# Patient Record
Sex: Male | Born: 1945 | Race: White | Hispanic: No | Marital: Married | Smoking: Never smoker
Health system: Southern US, Community
[De-identification: ages and names within clinical notes are randomized; demographics above are authoritative.]

## PROBLEM LIST (undated history)

## (undated) DIAGNOSIS — N138 Other obstructive and reflux uropathy: Secondary | ICD-10-CM

## (undated) DIAGNOSIS — G44209 Tension-type headache, unspecified, not intractable: Secondary | ICD-10-CM

## (undated) DIAGNOSIS — E785 Hyperlipidemia, unspecified: Secondary | ICD-10-CM

## (undated) DIAGNOSIS — R0982 Postnasal drip: Secondary | ICD-10-CM

## (undated) DIAGNOSIS — E663 Overweight: Secondary | ICD-10-CM

## (undated) DIAGNOSIS — N401 Enlarged prostate with lower urinary tract symptoms: Secondary | ICD-10-CM

## (undated) DIAGNOSIS — I10 Essential (primary) hypertension: Secondary | ICD-10-CM

## (undated) DIAGNOSIS — R0789 Other chest pain: Secondary | ICD-10-CM

## (undated) DIAGNOSIS — K5730 Diverticulosis of large intestine without perforation or abscess without bleeding: Secondary | ICD-10-CM

## (undated) HISTORY — DX: Benign prostatic hyperplasia with lower urinary tract symptoms: N40.1

## (undated) HISTORY — DX: Tension-type headache, unspecified, not intractable: G44.209

## (undated) HISTORY — DX: Hyperlipidemia, unspecified: E78.5

## (undated) HISTORY — DX: Other chest pain: R07.89

## (undated) HISTORY — DX: Postnasal drip: R09.82

## (undated) HISTORY — PX: COLONOSCOPY: SHX174

## (undated) HISTORY — DX: Essential (primary) hypertension: I10

## (undated) HISTORY — DX: Overweight: E66.3

## (undated) HISTORY — PX: TONSILLECTOMY: SUR1361

## (undated) HISTORY — DX: Other obstructive and reflux uropathy: N13.8

## (undated) HISTORY — DX: Diverticulosis of large intestine without perforation or abscess without bleeding: K57.30

---

## 2002-02-09 ENCOUNTER — Encounter: Payer: Self-pay | Admitting: Gastroenterology

## 2005-10-31 ENCOUNTER — Ambulatory Visit: Payer: Self-pay | Admitting: Internal Medicine

## 2005-11-14 ENCOUNTER — Ambulatory Visit: Payer: Self-pay | Admitting: Cardiology

## 2005-11-21 ENCOUNTER — Ambulatory Visit: Payer: Self-pay

## 2005-11-28 ENCOUNTER — Ambulatory Visit: Payer: Self-pay | Admitting: Cardiology

## 2006-12-09 ENCOUNTER — Ambulatory Visit: Payer: Self-pay | Admitting: Internal Medicine

## 2006-12-09 LAB — CONVERTED CEMR LAB
Albumin: 4.1 g/dL (ref 3.5–5.2)
Alkaline Phosphatase: 53 units/L (ref 39–117)
Basophils Absolute: 0 10*3/uL (ref 0.0–0.1)
Bilirubin Urine: NEGATIVE
Bilirubin, Direct: 0.3 mg/dL (ref 0.0–0.3)
CO2: 30 meq/L (ref 19–32)
Calcium: 9.2 mg/dL (ref 8.4–10.5)
Direct LDL: 125.7 mg/dL
GFR calc Af Amer: 79 mL/min
GFR calc non Af Amer: 66 mL/min
Glucose, Bld: 107 mg/dL — ABNORMAL HIGH (ref 70–99)
HCT: 52.5 % — ABNORMAL HIGH (ref 39.0–52.0)
HDL: 57.5 mg/dL (ref >39.0)
Hemoglobin: 17.8 g/dL — ABNORMAL HIGH (ref 13.0–17.0)
Ketones, ur: NEGATIVE mg/dL
Leukocytes, UA: NEGATIVE
Monocytes Absolute: 0.8 10*3/uL — ABNORMAL HIGH (ref 0.2–0.7)
Nitrite: NEGATIVE
PSA: 0.79 ng/mL (ref 0.10–4.00)
Potassium: 4.3 meq/L (ref 3.5–5.1)
RDW: 12 % (ref 11.5–14.6)
Specific Gravity, Urine: 1.01 (ref 1.000–1.03)
TSH: 0.5 microintl units/mL (ref 0.35–5.50)
Total Protein: 6.8 g/dL (ref 6.0–8.3)
Triglycerides: 101 mg/dL (ref 0–149)
VLDL: 20 mg/dL (ref 0–40)
WBC: 7.7 10*3/uL (ref 4.5–10.5)

## 2006-12-16 ENCOUNTER — Ambulatory Visit (HOSPITAL_COMMUNITY): Admission: RE | Admit: 2006-12-16 | Discharge: 2006-12-16 | Payer: Self-pay | Admitting: Internal Medicine

## 2007-10-12 DIAGNOSIS — J329 Chronic sinusitis, unspecified: Secondary | ICD-10-CM | POA: Insufficient documentation

## 2007-10-12 DIAGNOSIS — I10 Essential (primary) hypertension: Secondary | ICD-10-CM | POA: Insufficient documentation

## 2007-10-12 DIAGNOSIS — R0789 Other chest pain: Secondary | ICD-10-CM | POA: Insufficient documentation

## 2007-10-12 DIAGNOSIS — G44209 Tension-type headache, unspecified, not intractable: Secondary | ICD-10-CM

## 2007-10-12 DIAGNOSIS — E785 Hyperlipidemia, unspecified: Secondary | ICD-10-CM | POA: Insufficient documentation

## 2007-10-13 ENCOUNTER — Ambulatory Visit: Payer: Self-pay | Admitting: Internal Medicine

## 2007-10-13 ENCOUNTER — Telehealth (INDEPENDENT_AMBULATORY_CARE_PROVIDER_SITE_OTHER): Payer: Self-pay | Admitting: *Deleted

## 2008-01-06 ENCOUNTER — Ambulatory Visit: Payer: Self-pay | Admitting: Internal Medicine

## 2008-01-06 DIAGNOSIS — E663 Overweight: Secondary | ICD-10-CM | POA: Insufficient documentation

## 2008-01-06 DIAGNOSIS — K5730 Diverticulosis of large intestine without perforation or abscess without bleeding: Secondary | ICD-10-CM

## 2008-01-06 DIAGNOSIS — N400 Enlarged prostate without lower urinary tract symptoms: Secondary | ICD-10-CM

## 2008-04-04 ENCOUNTER — Ambulatory Visit: Payer: Self-pay | Admitting: Pulmonary Disease

## 2008-04-04 DIAGNOSIS — R21 Rash and other nonspecific skin eruption: Secondary | ICD-10-CM | POA: Insufficient documentation

## 2008-05-06 ENCOUNTER — Telehealth (INDEPENDENT_AMBULATORY_CARE_PROVIDER_SITE_OTHER): Payer: Self-pay | Admitting: *Deleted

## 2008-05-19 ENCOUNTER — Telehealth (INDEPENDENT_AMBULATORY_CARE_PROVIDER_SITE_OTHER): Payer: Self-pay | Admitting: *Deleted

## 2008-08-09 ENCOUNTER — Ambulatory Visit: Payer: Self-pay | Admitting: Internal Medicine

## 2008-08-09 DIAGNOSIS — R109 Unspecified abdominal pain: Secondary | ICD-10-CM | POA: Insufficient documentation

## 2008-11-22 ENCOUNTER — Ambulatory Visit: Payer: Self-pay | Admitting: Internal Medicine

## 2008-11-22 DIAGNOSIS — R109 Unspecified abdominal pain: Secondary | ICD-10-CM | POA: Insufficient documentation

## 2009-01-10 ENCOUNTER — Ambulatory Visit: Payer: Self-pay | Admitting: Gastroenterology

## 2009-01-10 DIAGNOSIS — K6289 Other specified diseases of anus and rectum: Secondary | ICD-10-CM

## 2009-01-10 DIAGNOSIS — R198 Other specified symptoms and signs involving the digestive system and abdomen: Secondary | ICD-10-CM

## 2009-01-13 ENCOUNTER — Ambulatory Visit: Payer: Self-pay | Admitting: Internal Medicine

## 2009-01-25 ENCOUNTER — Ambulatory Visit: Payer: Self-pay | Admitting: Gastroenterology

## 2009-06-29 ENCOUNTER — Ambulatory Visit: Payer: Self-pay | Admitting: Internal Medicine

## 2010-01-26 ENCOUNTER — Ambulatory Visit: Payer: Self-pay | Admitting: Internal Medicine

## 2010-01-26 LAB — CONVERTED CEMR LAB
ALT: 20 units/L (ref 0–53)
AST: 21 units/L (ref 0–37)
Albumin: 4.1 g/dL (ref 3.5–5.2)
BUN: 14 mg/dL (ref 6–23)
Basophils Absolute: 0.1 10*3/uL (ref 0.0–0.1)
Basophils Relative: 0.8 % (ref 0.0–3.0)
CRP, High Sensitivity: 0.47 (ref 0.00–5.00)
Cholesterol: 205 mg/dL — ABNORMAL HIGH (ref 0–200)
Creatinine, Ser: 1.1 mg/dL (ref 0.4–1.5)
Direct LDL: 125 mg/dL
Eosinophils Absolute: 0.1 10*3/uL (ref 0.0–0.7)
Eosinophils Relative: 1.1 % (ref 0.0–5.0)
HCT: 47.8 % (ref 39.0–52.0)
Lymphocytes Relative: 24 % (ref 12.0–46.0)
MCV: 94 fL (ref 78.0–100.0)
Neutro Abs: 4.2 10*3/uL (ref 1.4–7.7)
Neutrophils Relative %: 61.5 % (ref 43.0–77.0)
PSA: 0.63 ng/mL (ref 0.10–4.00)
RDW: 12.7 % (ref 11.5–14.6)
Total Bilirubin: 1.3 mg/dL — ABNORMAL HIGH (ref 0.3–1.2)
Total Protein: 6.8 g/dL (ref 6.0–8.3)
Triglycerides: 52 mg/dL (ref 0.0–149.0)
Urine Glucose: NEGATIVE mg/dL
Urobilinogen, UA: 0.2 (ref 0.0–1.0)
WBC: 6.9 10*3/uL (ref 4.5–10.5)

## 2010-08-19 LAB — CONVERTED CEMR LAB
ALT: 19 units/L (ref 0–53)
ALT: 20 units/L (ref 0–53)
AST: 22 units/L (ref 0–37)
Albumin: 4.1 g/dL (ref 3.5–5.2)
Alkaline Phosphatase: 41 units/L (ref 39–117)
Alkaline Phosphatase: 49 units/L (ref 39–117)
BUN: 19 mg/dL (ref 6–23)
Basophils Absolute: 0.1 10*3/uL (ref 0.0–0.1)
Basophils Relative: 0.7 % (ref 0.0–1.0)
Basophils Relative: 0.8 % (ref 0.0–3.0)
Bilirubin Urine: NEGATIVE
Bilirubin, Direct: 0.2 mg/dL (ref 0.0–0.3)
Bilirubin, Direct: 0.3 mg/dL (ref 0.0–0.3)
CO2: 32 meq/L (ref 19–32)
Calcium: 9.4 mg/dL (ref 8.4–10.5)
Chloride: 101 meq/L (ref 96–112)
Chloride: 105 meq/L (ref 96–112)
Creatinine, Ser: 1.2 mg/dL (ref 0.4–1.5)
Creatinine, Ser: 1.2 mg/dL (ref 0.4–1.5)
Direct LDL: 130.5 mg/dL
Eosinophils Relative: 1.3 % (ref 0.0–5.0)
Glucose, Bld: 111 mg/dL — ABNORMAL HIGH (ref 70–99)
HCT: 50.6 % (ref 39.0–52.0)
Hemoglobin, Urine: NEGATIVE
Hemoglobin: 16.9 g/dL (ref 13.0–17.0)
Lymphocytes Relative: 24.5 % (ref 12.0–46.0)
MCHC: 34.6 g/dL (ref 30.0–36.0)
MCV: 94 fL (ref 78.0–100.0)
MCV: 95 fL (ref 78.0–100.0)
Monocytes Absolute: 0.7 10*3/uL (ref 0.1–1.0)
Monocytes Relative: 10.4 % (ref 3.0–12.0)
Neutro Abs: 4.1 10*3/uL (ref 1.4–7.7)
Neutrophils Relative %: 62.2 % (ref 43.0–77.0)
PSA: 0.55 ng/mL (ref 0.10–4.00)
Potassium: 4.5 meq/L (ref 3.5–5.1)
RBC: 5.33 M/uL (ref 4.22–5.81)
Sodium: 140 meq/L (ref 135–145)
Sodium: 141 meq/L (ref 135–145)
Specific Gravity, Urine: 1.01 (ref 1.000–1.030)
TSH: 0.59 microintl units/mL (ref 0.35–5.50)
TSH: 0.67 microintl units/mL (ref 0.35–5.50)
Total CHOL/HDL Ratio: 3.7
Total Protein, Urine: NEGATIVE mg/dL
Triglycerides: 58 mg/dL (ref 0.0–149.0)
Triglycerides: 79 mg/dL (ref 0–149)
WBC: 6.5 10*3/uL (ref 4.5–10.5)

## 2010-08-21 NOTE — Assessment & Plan Note (Signed)
Summary: Primary svc/ cpx   Copy to:  n/a Primary Provider/Referring Provider:  Sandrea Hughs, Colin Palmer  CC:  cpx fasting.  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  He also has moderate obesity with a target weight of around 171 pounds.   Nov 22, 2008 change in bowel habits since changed diet to lots of greens assoc with migratory abd pain. no melena or hematochezia. colonoscopy nl 2003 except for diverticulosis.  no wt loss.  ? fever this am with no acute worsening in gi symptoms.   rx citrcucel/ diet > helped alot with pain but still irreg bm's  January 13, 2009 cpx better except bm still irreg and saw R Arlyce Dice and plan colonoscopy 01/25/09> nl  June 29, 2009 6 month followup hbp, wt control.  ex x 5 min on eliptical without cp/sob.  January 26, 2010 cpx no problem with aerobics in terms of sob or cp. no tia or claudication  Current Medications (verified): 1)  Bayer Aspirin 325 Mg  Tabs (Aspirin) .... Take 1 Tablet By Mouth Once A Day 2)  Micardis Hct 80-25 Mg  Tabs (Telmisartan-Hctz) .... Take 1 Tablet By Mouth Once A Day 3)  Toprol Xl 25 Mg  Tb24 (Metoprolol Succinate) .... Take 1 Tablet By Mouth Once A Day 4)  Advil 200 Mg  Tabs (Ibuprofen) .... As Needed 5)  Tums 500 Mg Chew (Calcium Carbonate Antacid) .... As Needed 6)  Fish Oil   Oil (Fish Oil) .Marland Kitchen.. 1 Two Times A Day 7)  Citrucel  Powd (Methylcellulose (Laxative)) .Marland Kitchen.. 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 lung cancer in father ovarian  cancer in mother no siblings No FH of Colon Cancer  Social History: Reviewed history from 01/10/2009 and no changes required. denies ever smoking works as Pensions consultant very stressful Alcohol Use - yes: one glass of wine daily Illicit Drug Use - no Daily Caffeine Use: one cup of coffee daily  Patient gets regular exercise.  Vital Signs:  Patient profile:   65 year old male Weight:      173 pounds BMI:     26.40 O2 Sat:      97 % on Room air Temp:     98.0 degrees F oral Pulse rate:   83 / minute BP sitting:   110 / 80  (left arm)  Vitals Entered By: Vernie Murders (January 26, 2010 8:50 AM)  O2 Flow:  Room air  Physical Exam  Additional Exam:  ambulatory healthy appearing white male in no distress.  wt 194 August 09, 2008 > 191 Nov 22, 2008 >  185 January 13, 2009 > 179 June 29, 2009 > 173 January 26, 2010   HEENT: nl dentition, turbinates, and orophanx. Nl external ear canals without cough reflex NECK :  without JVD/Nodes/TM/ nl carotid upstrokes bilaterally LUNGS: no acc muscle use, clear to A and P bilaterally without cough on insp or exp maneuvers CV:  RRR  no s3 or murmur or increase in P2, no edema  ABD:  soft and nontender with nl excursion in the supine position. No bruits or organomegaly, bowel sounds nl MS:  warm without deformities, calf tenderness, cyanosis or clubbing GU testes down bilaterallly, no nodules or ih Rectal mild bph,  smooth, stool G neg no nodules Skin fair complexion no  lestions   Cholesterol          [H]  205 mg/dL                   0-454     ATP III Classification            Desirable:  < 200 mg/dL                    Borderline High:  200 - 239 mg/dL               High:  > = 240 mg/dL   Triglycerides             52.0 mg/dL                  0.9-811.9     Normal:  <150 mg/dL     Borderline High:  147 - 199 mg/dL   HDL                       82.95 mg/dL                 >62.13   VLDL Cholesterol          10.4 mg/dL                  0.8-65.7  CHO/HDL Ratio:  CHD Risk                             3                    Men          Women     1/2 Average Risk     3.4          3.3     Average Risk          5.0          4.4     2X Average Risk          9.6          7.1     3X Average Risk          15.0          11.0                           Tests: (2) BMP (METABOL)   Sodium                    138 mEq/L  135-145   Potassium                 4.3 mEq/L                   3.5-5.1   Chloride                  102 mEq/L                   96-112   Carbon Dioxide            29 mEq/L                    19-32   Glucose              [H]  102 mg/dL                   16-10   BUN                       14 mg/dL                    9-60   Creatinine                1.1 mg/dL                   4.5-4.0   Calcium                   9.2 mg/dL                   9.8-11.9   GFR                       71.63 mL/min                >60  Tests: (3) CBC Platelet w/Diff (CBCD)   White Cell Count          6.9 K/uL                    4.5-10.5   Red Cell Count            5.09 Mil/uL                 4.22-5.81   Hemoglobin                16.6 g/dL                   14.7-82.9   Hematocrit                47.8 %                      39.0-52.0   MCV                       94.0 fl                     78.0-100.0   MCHC                      34.8 g/dL                   56.2-13.0   RDW  12.7 %                       11.5-14.6   Platelet Count            280.0 K/uL                  150.0-400.0   Neutrophil %              61.5 %                      43.0-77.0   Lymphocyte %              24.0 %                      12.0-46.0   Monocyte %           [H]  12.6 %                      3.0-12.0   Eosinophils%              1.1 %                       0.0-5.0   Basophils %               0.8 %                       0.0-3.0   Neutrophill Absolute      4.2 K/uL                    1.4-7.7   Lymphocyte Absolute       1.6 K/uL                    0.7-4.0   Monocyte Absolute         0.9 K/uL                    0.1-1.0  Eosinophils, Absolute                             0.1 K/uL                    0.0-0.7   Basophils Absolute        0.1 K/uL                    0.0-0.1  Tests: (4) Hepatic/Liver Function Panel (HEPATIC)   Total Bilirubin      [H]  1.3 mg/dL                   1.6-1.0   Direct Bilirubin          0.2 mg/dL                   9.6-0.4   Alkaline Phosphatase      44 U/L                      39-117   AST                       21 U/L  0-37   ALT                       20 U/L                      0-53   Total Protein             6.8 g/dL                    9.8-1.1   Albumin                   4.1 g/dL                    9.1-4.7  Tests: (5) TSH (TSH)   FastTSH                   0.66 uIU/mL                 0.35-5.50  Tests: (6) Prostate Specific Antigen (PSA)   PSA-Hyb                   0.63 ng/mL                  0.10-4.00  Tests: (7) UDip Only (UDIP)   Color                     YELLOW       RANGE:  Yellow;Lt. Yellow   Clarity                   CLEAR                       Clear   Specific Gravity          1.010                       1.000 - 1.030   Urine Ph                  7.0                         5.0-8.0   Protein                   TRACE                       Negative   Urine Glucose             NEGATIVE                    Negative   Ketones                   NEGATIVE                     Negative   Urine Bilirubin           NEGATIVE                    Negative   Blood                     NEGATIVE                    Negative  Urobilinogen              0.2                         0.0 - 1.0   Leukocyte Esterace        NEGATIVE                    Negative   Nitrite                   NEGATIVE                    Negative  Tests: (8) Full Range CRP (FCRP)   CRPH                      0.47 mg/L                   0.00-5.00     Note:  An elevated hs-CRP (>5 mg/L) should be repeated after 2 weeks to rule out recent infection or trauma.  Tests: (9) Cholesterol LDL - Direct (DIRLDL)  Cholesterol LDL - Direct                             125.0 mg/dL  CXR  Procedure date:  01/26/2010  Findings:      Comparison: 01/13/2009   Findings: Mild convex right thoracic spine curvature with spondylosis. Midline trachea.  Normal heart size and mediastinal contours. No pleural effusion or pneumothorax.  Clear lungs.   IMPRESSION: No acute cardiopulmonary disease.  Impression & Recommendations:  Problem # 1:  HYPERTENSION (ICD-401.9) ok on rx  His updated medication list for this problem includes:    Micardis Hct 80-25 Mg Tabs (Telmisartan-hctz) .Marland Kitchen... Take 1 tablet by mouth once a day    Toprol Xl 25 Mg Tb24 (Metoprolol succinate) .Marland Kitchen... Take 1 tablet by mouth once a day     Problem # 2:  HYPERLIPIDEMIA (ICD-272.4)  Target LDL < 130   Labs Reviewed: SGOT: 22 (01/13/2009)   SGPT: 19 (01/13/2009)   HDL:62.60 (01/13/2009), 54.8 (01/06/2008)  LDL:115 (01/13/2009) > 125 January 26, 2010 so no change in rx , DEL (01/06/2008)  Chol:189 (01/13/2009), 202 (01/06/2008)  Trig:58.0 (01/13/2009), 79 (01/06/2008)  Medications Added to Medication List This Visit: 1)  Fish Oil Oil (Fish oil) .Marland Kitchen.. 1 two times a day 2)  Citrucel Powd (Methylcellulose (laxative)) .Marland Kitchen.. 1 tbsp once daily  Other Orders: EKG w/ Interpretation (93000) TLB-Lipid Panel (80061-LIPID) TLB-BMP (Basic Metabolic  Panel-BMET) (80048-METABOL) TLB-CBC Platelet - w/Differential (85025-CBCD) TLB-Hepatic/Liver Function Pnl (80076-HEPATIC) TLB-TSH (Thyroid Stimulating Hormone) (84443-TSH) TLB-PSA (Prostate Specific Antigen) (84153-PSA) TLB-Udip ONLY (81003-UDIP) TLB-CRP-High Sensitivity (C-Reactive Protein) (86140-FCRP) T-2 View CXR (71020TC) Est. Patient 40-64 years (29528)  Patient Instructions: 1)  Call [phone] for your results w/in next 3 days - if there's something important  I feel you need to know,  I'll be in touch with you directly.    CardioPerfect ECG  ID: [id number] Patient: Colin Palmer, Colin Palmer DOB: 1945-11-18 Age: 65 Years Old Sex: Male Race: Other Height: 68 Weight: 173 Status: Unconfirmed Past Medical History:  OVERWEIGHT (ICD-278.02)     - Target wt  =  185  for BMI < 30,   ideal 166 for BMI < 26 DIVERTICULOSIS OF COLON (ICD-562.10).............................................R Arlyce Dice     - Diverticulosis by colonoscopy July 2003, repeat  January 25, 2009 no change    BENIGN PROSTATIC HYPERTROPHY, WITH OBSTRUCTION (ICD-600.01) HYPERLIPIDEMIA (ICD-272.4) target LDL < 130 male, hbp HEADACHE, TENSION (ICD-307.81) 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

## 2010-12-04 NOTE — Assessment & Plan Note (Signed)
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  He has  moderate obesity as well and has not been making progress with a target  weight of 171 pounds but has been relatively stable around 200 pounds  and for the last several years.  He denies however, any exertional chest  pain, orthopnea, PND or leg swelling and his weight is actually trending  slightly downward over time.   His new complaint today is one of right arm paresthesias that extended  into the C6 dermatome.  This occurred acutely after an injury in his  right arm in September 2007.  He denies any associated neck pain or  weakness in the arm.  His main complaint is one of increased  sensitivity of the skin.   PAST MEDICAL HISTORY:  Is significant for:  1. Chest pain, migratory in nature and felt to be atypical of negative      cardiac workup in May 2007, including a stress nuclear study.  2. Hypertension.  3. Non-specific post nasal drip syndrome.  4. Stress related headaches.  5. Hyperlipidemia as noted above.   ALLERGIES:  None known.   MEDICATIONS:  1. Micardis 80/25 one daily.  2. Toprol XL 25 mg daily.  3. Adult aspirin daily.   SOCIAL HISTORY:  He has never smoked.  He works as a Clinical research associate.  He admits  he does not get any regular exercise.   FAMILY HISTORY:  Is positive for lung cancer in his father, ovarian  cancer in his mother and he has no siblings.   REVIEW OF SYSTEMS:  Taken in detail in worksheet.  Negative except as  outlined above.   PHYSICAL  EXAMINATION:  This is a pleasant ambulatory fit appearing white  male, no acute distress with the weight now of 191 pounds, which is down  about 5 pounds from a year ago with a peak weight on our records of 211.  HEENT:  Is unremarkable.  Oropharynx is clear.  Dentition is intact.  Limited funduscopy revealed no significant retinal or arteriolar change.  NECK:  Was supple without cervical adenopathy or tenderness and there  were no radicular symptoms elicited on manipulation.  There is a regular rhythm without murmur, gallop, rub or displacement of  PMI.  ABDOMEN:  Is soft, benign with no palpable organomegaly, masses or  tenderness.  EXTREMITIES:  Are warm without calf tenderness, cyanosis, clubbing or  edema.  NEUROLOGIC:  Exam of the right upper extremity was normal with normal  pulses.   LAB DATA:  Chest x-ray was normal.  EKG was normal.  Chemistry profile  was unremarkable.  LDL cholesterol  was 126 with an HDL of 58, CBC was  normal.  TSH was normal, PSA was 0.79.   IMPRESSION:  1. 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  I      suspect he stretched the C6 nerve root when he hyperextended his      right arm but would like to rule out a disc protruding at the C6      level and recommended an MRI scan for this purpose.  2. Atypical chest pain, has resolved over the last year with negative      stress Cardiolite in 2007.  This is either related to stress or      irritable bowel syndrome or reflux but has not been a recurrent      problem.  3. Hyperlipidemia with target LDL at target of less than 130.  4. History of seasonal rhinitis, not requiring treatment.  5. Diverticulosis by colonoscopy July 2003 and could be for recall      July 2008.  6. History of headaches, no longer active.  7. General health maintenance.  He was updated on tetanus 2007, not      yet a candidate for Pneumovax.     Charlaine Dalton. Sherene Sires, MD, [facility]  Electronically Signed    MBW/MedQ   DD: 12/11/2006  DT: 12/11/2006  Job #: [id number]

## 2011-02-04 ENCOUNTER — Encounter: Payer: Self-pay | Admitting: Internal Medicine

## 2011-02-05 ENCOUNTER — Ambulatory Visit (INDEPENDENT_AMBULATORY_CARE_PROVIDER_SITE_OTHER): Payer: BC Managed Care – PPO | Admitting: Internal Medicine

## 2011-02-05 ENCOUNTER — Other Ambulatory Visit (INDEPENDENT_AMBULATORY_CARE_PROVIDER_SITE_OTHER): Payer: BC Managed Care – PPO

## 2011-02-05 ENCOUNTER — Ambulatory Visit (INDEPENDENT_AMBULATORY_CARE_PROVIDER_SITE_OTHER)
Admission: RE | Admit: 2011-02-05 | Discharge: 2011-02-05 | Disposition: A | Discharge status: Home / Self Care | Payer: BC Managed Care – PPO | Source: Ambulatory Visit | Attending: Internal Medicine | Admitting: Internal Medicine

## 2011-02-05 ENCOUNTER — Encounter: Payer: Self-pay | Admitting: Internal Medicine

## 2011-02-05 VITALS — BP 116/80 | HR 73 | Temp 98.5°F | Ht 68.0 in | Wt 175.0 lb

## 2011-02-05 DIAGNOSIS — E785 Hyperlipidemia, unspecified: Secondary | ICD-10-CM

## 2011-02-05 DIAGNOSIS — R21 Rash and other nonspecific skin eruption: Secondary | ICD-10-CM

## 2011-02-05 DIAGNOSIS — I10 Essential (primary) hypertension: Secondary | ICD-10-CM

## 2011-02-05 DIAGNOSIS — R109 Unspecified abdominal pain: Secondary | ICD-10-CM

## 2011-02-05 LAB — CBC WITH DIFFERENTIAL/PLATELET
Basophils Relative: 0.5 % (ref 0.0–3.0)
Eosinophils Relative: 1.1 % (ref 0.0–5.0)
HCT: 46.7 % (ref 39.0–52.0)
Lymphs Abs: 1.4 10*3/uL (ref 0.7–4.0)
MCV: 94.7 fl (ref 78.0–100.0)
Monocytes Absolute: 0.7 10*3/uL (ref 0.1–1.0)
Monocytes Relative: 10.8 % (ref 3.0–12.0)
Neutrophils Relative %: 67.2 % (ref 43.0–77.0)
RBC: 4.94 Mil/uL (ref 4.22–5.81)
WBC: 6.9 10*3/uL (ref 4.5–10.5)

## 2011-02-05 LAB — BASIC METABOLIC PANEL
BUN: 13 mg/dL (ref 6–23)
CO2: 30 mEq/L (ref 19–32)
Chloride: 98 mEq/L (ref 96–112)
Potassium: 4.3 mEq/L (ref 3.5–5.1)

## 2011-02-05 LAB — LIPID PANEL
HDL: 77.5 mg/dL (ref >39.00)
Total CHOL/HDL Ratio: 2
VLDL: 8.8 mg/dL (ref 0.0–40.0)

## 2011-02-05 LAB — URINALYSIS
Ketones, ur: NEGATIVE
Leukocytes, UA: NEGATIVE
Specific Gravity, Urine: 1.015 (ref 1.000–1.030)
pH: 7 (ref 5.0–8.0)

## 2011-02-05 LAB — TSH: TSH: 0.69 u[IU]/mL (ref 0.35–5.50)

## 2011-02-05 LAB — HEPATIC FUNCTION PANEL
Albumin: 4.5 g/dL (ref 3.5–5.2)
Total Bilirubin: 1.3 mg/dL — ABNORMAL HIGH (ref 0.3–1.2)

## 2011-02-05 NOTE — Progress Notes (Signed)
Quick Note:  Spoke with pt and notified of results per Dr. Wert. Pt verbalized understanding and denied any questions.  ______ 

## 2011-02-05 NOTE — Assessment & Plan Note (Signed)
Adequate control on present rx, reviewed  

## 2011-02-05 NOTE — Patient Instructions (Signed)
We will call you with lab reports  Keep up the good work on exercise  Return in one year for physical, sooner if needed

## 2011-02-05 NOTE — Progress Notes (Signed)
Subjective:     Patient ID: Colin Palmer, male   DOB: Mar 18, 1946, 65 y.o.   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 He also has moderate obesity with a target weight of around 171 pounds.   Nov 22, 2008 change in bowel habits since changed diet to lots of greens assoc with migratory abd pain. no melena or hematochezia. colonoscopy nl 2003 except for diverticulosis. no wt loss. ? fever this am with no acute worsening in gi symptoms. rx citrcucel/ diet > helped alot with pain but still irreg bm's    January 13, 2009 cpx better except bm still irreg and saw R Arlyce Dice and plan colonoscopy 01/25/09> nl   02/05/2011  Ov/ Simona Rocque cpx   tia or claudication. Pt denies any significant sore throat, dysphagia, itching, sneezing,  nasal congestion or excess/ purulent secretions,  fever, chills, sweats, unintended wt loss, pleuritic or exertional cp, hempoptysis, orthopnea pnd or leg swelling.    Also denies any obvious fluctuation of symptoms with weather or environmental changes or other aggravating or alleviating factors.             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)  CHEST PAIN, ATYPICAL (ICD-786.59)  HYPERTENSION (ICD-401.9)  Fair complexion Pos Ak's.......................................................................Marland KitchenTurner  HEALTH MAINTENANCE.........................................................................Marland KitchenWert  - Td 10/2005  - CPX 02/05/2011   Family  History:   lung cancer in father  ovarian cancer in mother  no siblings  No FH of Colon Cancer   Social History:   denies ever smoking  works as Pensions consultant very stressful  Alcohol Use - yes: one glass of wine daily  Illicit Drug Use - no  Daily Caffeine Use: one cup of coffee daily  Patient gets regular exercise.     Review of Systems     Objective:   Physical Exam ambulatory healthy appearing white male in no distress.   wt 194 August 09, 2008 >   > 179 June 29, 2009 > 173 January 26, 2010 > 02/05/2011 175 HEENT: nl dentition, turbinates, and orophanx. Nl external ear canals without cough reflex  NECK : without JVD/Nodes/TM/ nl carotid upstrokes bilaterally  LUNGS: no acc muscle use, clear to A and P bilaterally without cough on insp or exp maneuvers  CV: RRR no s3 or murmur or increase in P2, no edema  ABD: soft and nontender with nl excursion in the supine position. No bruits or organomegaly, bowel sounds nl  MS: warm without deformities, calf tenderness, cyanosis or clubbing  GU testes down bilaterallly, no nodules or ih  Rectal mild bph, smooth, stool G neg no nodules  Skin fair complexion no lestions    02/05/2011 cxr No acute disease. Stable with prior exam.   Assessment:         Plan:

## 2011-08-21 ENCOUNTER — Other Ambulatory Visit: Payer: Self-pay | Admitting: Internal Medicine

## 2011-08-21 MED ORDER — TELMISARTAN-HCTZ 80-25 MG PO TABS: 1.0000 | ORAL_TABLET | Freq: Every day | ORAL | Status: DC

## 2011-08-21 MED ORDER — METOPROLOL SUCCINATE ER 25 MG PO TB24: 25.0000 mg | ORAL_TABLET | Freq: Every day | ORAL | Status: DC

## 2011-08-21 NOTE — Telephone Encounter (Signed)
Rx refills for [facility]oprolol and [facility]is [facility] per [facility], okayed by MW.

## 2012-02-19 ENCOUNTER — Other Ambulatory Visit (INDEPENDENT_AMBULATORY_CARE_PROVIDER_SITE_OTHER): Payer: BC Managed Care – PPO

## 2012-02-19 ENCOUNTER — Ambulatory Visit (INDEPENDENT_AMBULATORY_CARE_PROVIDER_SITE_OTHER): Payer: BC Managed Care – PPO | Admitting: Internal Medicine

## 2012-02-19 ENCOUNTER — Ambulatory Visit (INDEPENDENT_AMBULATORY_CARE_PROVIDER_SITE_OTHER)
Admission: RE | Admit: 2012-02-19 | Discharge: 2012-02-19 | Disposition: A | Discharge status: Home / Self Care | Payer: BC Managed Care – PPO | Source: Ambulatory Visit | Attending: Internal Medicine | Admitting: Internal Medicine

## 2012-02-19 ENCOUNTER — Encounter: Payer: Self-pay | Admitting: Internal Medicine

## 2012-02-19 VITALS — BP 120/70 | HR 67 | Temp 98.4°F | Ht 67.0 in | Wt 176.4 lb

## 2012-02-19 DIAGNOSIS — I10 Essential (primary) hypertension: Secondary | ICD-10-CM

## 2012-02-19 DIAGNOSIS — E785 Hyperlipidemia, unspecified: Secondary | ICD-10-CM

## 2012-02-19 DIAGNOSIS — K5730 Diverticulosis of large intestine without perforation or abscess without bleeding: Secondary | ICD-10-CM

## 2012-02-19 DIAGNOSIS — Z23 Encounter for immunization: Secondary | ICD-10-CM

## 2012-02-19 DIAGNOSIS — N401 Enlarged prostate with lower urinary tract symptoms: Secondary | ICD-10-CM

## 2012-02-19 LAB — BASIC METABOLIC PANEL
BUN: 12 mg/dL (ref 6–23)
CO2: 30 mEq/L (ref 19–32)
Chloride: 94 mEq/L — ABNORMAL LOW (ref 96–112)
Potassium: 4.5 mEq/L (ref 3.5–5.1)

## 2012-02-19 LAB — URINALYSIS
Nitrite: NEGATIVE
Total Protein, Urine: NEGATIVE
Urine Glucose: NEGATIVE
pH: 7 (ref 5.0–8.0)

## 2012-02-19 LAB — CBC WITH DIFFERENTIAL/PLATELET
Basophils Absolute: 0.1 10*3/uL (ref 0.0–0.1)
Eosinophils Absolute: 0.1 10*3/uL (ref 0.0–0.7)
HCT: 49.9 % (ref 39.0–52.0)
Lymphs Abs: 1.4 10*3/uL (ref 0.7–4.0)
MCHC: 34.4 g/dL (ref 30.0–36.0)
MCV: 95.3 fl (ref 78.0–100.0)
Monocytes Absolute: 0.7 10*3/uL (ref 0.1–1.0)
Platelets: 287 10*3/uL (ref 150.0–400.0)
RDW: 12.7 % (ref 11.5–14.6)

## 2012-02-19 LAB — LIPID PANEL
HDL: 79.3 mg/dL (ref >39.00)
Triglycerides: 52 mg/dL (ref 0.0–149.0)
VLDL: 10.4 mg/dL (ref 0.0–40.0)

## 2012-02-19 LAB — HEPATIC FUNCTION PANEL
ALT: 17 U/L (ref 0–53)
AST: 17 U/L (ref 0–37)
Alkaline Phosphatase: 47 U/L (ref 39–117)
Total Bilirubin: 1.2 mg/dL (ref 0.3–1.2)

## 2012-02-19 NOTE — Progress Notes (Signed)
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 He also has moderate obesity with a target weight of around 171 pounds.   Nov 22, 2008 change in bowel habits since changed diet to lots of greens assoc with migratory abd pain. no melena or hematochezia. colonoscopy nl 2003 except for diverticulosis. no wt loss. ? fever this am with no acute worsening in gi symptoms. rx citrcucel/ diet > helped alot with pain but still irreg bm's    January 13, 2009 cpx better except bm still irreg and saw R Arlyce Dice and plan colonoscopy 01/25/09> nl   02/19/2012 CPX/Reather Steller cc  No complaints, no tia, claudication, cp, sob. Good ex tol  ROS  The following are not active complaints unless bolded sore throat, dysphagia, dental problems, itching, sneezing,  nasal congestion or excess/ purulent secretions, ear ache,   fever, chills, sweats, unintended wt loss, pleuritic or exertional cp, hemoptysis,  orthopnea pnd or leg swelling, presyncope, palpitations, heartburn, abdominal pain, anorexia, nausea, vomiting, diarrhea  or change in bowel or urinary habits, change in stools or urine, dysuria,hematuria,  rash, arthralgias, visual complaints, headache, numbness weakness or ataxia or problems with walking or coordination,  change in mood/affect or memory.            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)  CHEST PAIN, ATYPICAL (ICD-786.59)    HYPERTENSION (ICD-401.9)  Fair complexion Pos Ak's.......................................................................Marland KitchenTurner  HEALTH MAINTENANCE.........................................................................Marland KitchenWert  - Td 10/2005  - Pneumovax 02/19/2012 (age 66)  - CPX 02/19/2012   Family History:  lung cancer in father  ovarian cancer in mother  no siblings  No FH of Colon Cancer   Social History:   denies ever smoking  works as Pensions consultant very stressful  Alcohol Use - yes: one glass of wine daily  Illicit Drug Use - no  Daily Caffeine Use: one cup of coffee daily  Patient gets regular exercise.           Objective:   Physical Exam ambulatory healthy appearing white male in no distress.   wt 194 August 09, 2008 > 179 June 29, 2009 >  02/05/2011 175> 02/19/2012  176 HEENT: nl dentition, turbinates, and orophanx. Nl external ear canals without cough reflex  NECK : without JVD/Nodes/TM/ nl carotid upstrokes bilaterally  LUNGS: no acc muscle use, clear to A and P bilaterally without cough on insp or exp maneuvers  CV: RRR no s3 or murmur or increase in P2, no edema  ABD: soft and nontender with nl excursion in the supine position. No bruits or organomegaly, bowel sounds nl  MS: warm without deformities, calf tenderness, cyanosis or clubbing  GU testes down bilaterallly, no nodules or ih  Rectal mild bph, smooth, stool G neg no nodules  Skin fair complexion no lestions  CXR  02/19/2012 : Comparison: 02/05/2011  Findings: Cardiomediastinal silhouette is stable. No acute infiltrate or pleural effusion. No pulmonary edema. Stable minimal dextroscoliosis.  IMPRESSION: No active disease.  No significant change.       Assessment:         Plan:

## 2012-02-19 NOTE — Patient Instructions (Addendum)
Pneumovax given today.  Please remember to go to the lab and x-ray department downstairs for your tests - we will call you with the results when they are available- and fax you at that point any results you need for your insurance discount     Please schedule a follow up office visit in 12 months, call sooner if needed  - CPX on return

## 2012-02-20 ENCOUNTER — Telehealth: Payer: Self-pay | Admitting: Internal Medicine

## 2012-02-20 NOTE — Assessment & Plan Note (Signed)
-   Diverticulosis by colonoscopy July 2003, repeat January 25, 2009 no change

## 2012-02-20 NOTE — Assessment & Plan Note (Signed)
Discussed in detail all the  indications, usual  risks and alternatives  relative to the benefits with patient who declined psa testing

## 2012-02-20 NOTE — Progress Notes (Signed)
Quick Note:  Spoke with pt and notified of results per Dr. Wert. Pt verbalized understanding and denied any questions.  ______ 

## 2012-02-20 NOTE — Assessment & Plan Note (Signed)
Adequate control on present rx, reviewed    Has mild decrease in na on hctz, not enough to warrant change in rx

## 2012-02-20 NOTE — Telephone Encounter (Signed)
Spoke with pt and notified of results per Dr. Sherene Sires. Pt verbalized understanding and denied any questions. Results faxed to the pt per his request to [phone].

## 2012-02-20 NOTE — Assessment & Plan Note (Signed)
-   Target LDL < 130 due to hbp/ male gender/ type A  Adequate control on present rx, reviewed

## 2012-08-24 ENCOUNTER — Telehealth: Payer: Self-pay | Admitting: Internal Medicine

## 2012-08-24 MED ORDER — OLMESARTAN MEDOXOMIL-HCTZ 20-12.5 MG PO TABS: 1.0000 | ORAL_TABLET | Freq: Every day | ORAL | Status: DC

## 2012-08-24 NOTE — Telephone Encounter (Signed)
Pt states he would like to have a 30 day supply of the Benicar sent to [facility] on [address],  He states he still has about 3 weeks left of the Micardis and would like to finish that up first since he has paid for it. I told him that this is fine and will ask that the pharmacy HOLD the Benicar until the pt calls for it. Pt aware he will need to come back for BP check after he starts the Benicar and he will call out office to set this up once he fills the Benicar RX.

## 2012-08-24 NOTE — Telephone Encounter (Signed)
Error  Dup message °

## 2012-08-24 NOTE — Telephone Encounter (Signed)
lori has spoken to pt and has taken care of this

## 2012-08-24 NOTE — Telephone Encounter (Addendum)
Pt's Micardis changed to Benicar 20/12.5 due to coverage. Philipp Deputy spoke with Trinna Post at [facility] with instructions to discontinue the Micardis prescription. ATC pt at [phone] but had to [facility]. Would like to ask if pt wants a 90 day supply sent to [facility] or a 30 day supply sent to a local pharmacy since this is a new medication. Will hold msg in triage since the msg has already been closed.

## 2012-08-24 NOTE — Telephone Encounter (Signed)
Spoke to pharmacy and they had rx's for metoprolol and benicar, I do not show the patient to be on the benicar, I cancelled the order for benicar and gave new verbal for micardis 80/25 and confirmed order for metoprolol also gave npi # for mw, all has been corrected and will notify patient in the other message taken 2/3 from the patient.

## 2012-08-24 NOTE — Addendum Note (Signed)
Addended by: Michel Bickers A on: 08/24/2012 04:27 PM   Modules accepted: Orders

## 2012-11-05 ENCOUNTER — Encounter: Payer: Self-pay | Admitting: Internal Medicine

## 2012-11-05 ENCOUNTER — Ambulatory Visit (INDEPENDENT_AMBULATORY_CARE_PROVIDER_SITE_OTHER): Payer: BC Managed Care – PPO | Admitting: Internal Medicine

## 2012-11-05 VITALS — BP 140/82 | HR 72 | Temp 97.9°F | Ht 67.5 in | Wt 177.8 lb

## 2012-11-05 DIAGNOSIS — I10 Essential (primary) hypertension: Secondary | ICD-10-CM

## 2012-11-05 DIAGNOSIS — E785 Hyperlipidemia, unspecified: Secondary | ICD-10-CM

## 2012-11-05 NOTE — Progress Notes (Signed)
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 He also has moderate obesity with a target weight of around 171 pounds.   Nov 22, 2008 change in bowel habits since changed diet to lots of greens assoc with migratory abd pain. no melena or hematochezia. colonoscopy nl 2003 except for diverticulosis. no wt loss. ? fever this am with no acute worsening in gi symptoms. rx citrcucel/ diet > helped alot with pain but still irreg bm's    January 13, 2009 cpx better except bm still irreg and saw R Arlyce Dice and plan colonoscopy 01/25/09> nl   02/19/2012 CPX/Wert cc  No complaints, no tia, claudication, cp, sob. Good ex tol rec No change rx   08/24/12 Changed to benicar due to insurance formulary  11/04/2012 f/u ov/Wert re BP f/u Chief Complaint  Patient presents with  . Follow-up    BP doing well since changed from micardis to benicar. No co's today.   no cp, sob, tia, claudication, presyncop since chage to benicar    ROS  The following are not active complaints unless bolded sore throat, dysphagia, dental problems, itching, sneezing,  nasal congestion or excess/ purulent secretions, ear ache,   fever, chills, sweats, unintended wt loss, pleuritic or exertional cp, hemoptysis,  orthopnea pnd or leg swelling, presyncope, palpitations, heartburn, abdominal pain, anorexia, nausea, vomiting, diarrhea  or change in bowel or urinary habits, change in stools or urine, dysuria,hematuria,  rash, arthralgias, visual complaints, headache, numbness weakness or ataxia or problems with walking or coordination,  change in mood/affect or memory.            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)  CHEST PAIN, ATYPICAL (ICD-786.59)  HYPERTENSION (ICD-401.9)  Fair complexion Pos Ak's.......................................................................Marland KitchenTurner  HEALTH MAINTENANCE.........................................................................Marland KitchenWert  - Td 10/2005  - Pneumovax 02/19/2012 (age 67)  - CPX 02/19/2012   Family History:  lung cancer in father  ovarian cancer in mother  no siblings  No FH of Colon Cancer   Social History:   denies ever smoking  works as Pensions consultant very stressful  Alcohol Use - yes: one glass of wine daily  Illicit Drug Use - no  Daily Caffeine Use: one cup of coffee daily  Patient gets regular exercise.           Objective:   Physical Exam ambulatory healthy appearing white male in no distress.   wt 194 August 09, 2008 > 179 June 29, 2009 >  02/05/2011 175> 02/19/2012  176> 11/06/2012  177 HEENT: nl dentition, turbinates, and orophanx. Nl external ear canals without cough reflex  NECK : without JVD/Nodes/TM/ nl carotid upstrokes bilaterally  LUNGS: no acc muscle use, clear to A and P bilaterally without cough on insp or exp maneuvers  CV: RRR no s3 or murmur or increase in P2, no edema  ABD: soft and nontender with nl excursion in the supine position. No bruits or organomegaly, bowel sounds nl  MS: warm without  deformities, calf tenderness, cyanosis or clubbing  Skin fair complexion no lestions  CXR  02/19/2012 : Comparison: 02/05/2011  Findings: Cardiomediastinal silhouette is stable. No acute infiltrate or pleural effusion. No pulmonary edema. Stable minimal dextroscoliosis.  IMPRESSION: No active disease. No significant change.       Assessment:          Plan:

## 2012-11-05 NOTE — Patient Instructions (Addendum)
No change in your regimen  Schedule a cpx after 02/19/13

## 2012-11-06 NOTE — Assessment & Plan Note (Signed)
Lab Results  Component Value Date   CREATININE 1.1 02/19/2012   CREATININE 1.1 02/05/2011   CREATININE 1.1 01/26/2010    Adequate control on present rx, reviewed

## 2012-11-06 NOTE — Assessment & Plan Note (Signed)
Lab Results  Component Value Date   LDLCALC 102* 02/19/2012     Adequate control on present rx, reviewed

## 2012-11-24 ENCOUNTER — Telehealth: Payer: Self-pay | Admitting: Internal Medicine

## 2012-11-24 MED ORDER — OLMESARTAN MEDOXOMIL-HCTZ 20-12.5 MG PO TABS: 1.0000 | ORAL_TABLET | Freq: Every day | ORAL | Status: DC

## 2012-11-24 NOTE — Telephone Encounter (Signed)
Pt aware rx has been sent. Nothing further was eneded

## 2013-01-20 ENCOUNTER — Telehealth: Payer: Self-pay | Admitting: Internal Medicine

## 2013-01-20 NOTE — Telephone Encounter (Signed)
Called pt to schd 1 year follow up apt. Patient phone disconnected. Sent letter 01/19/13

## 2013-02-24 ENCOUNTER — Other Ambulatory Visit (INDEPENDENT_AMBULATORY_CARE_PROVIDER_SITE_OTHER): Payer: BC Managed Care – PPO

## 2013-02-24 ENCOUNTER — Ambulatory Visit (INDEPENDENT_AMBULATORY_CARE_PROVIDER_SITE_OTHER)
Admission: RE | Admit: 2013-02-24 | Discharge: 2013-02-24 | Disposition: A | Discharge status: Home / Self Care | Payer: BC Managed Care – PPO | Source: Ambulatory Visit | Attending: Internal Medicine | Admitting: Internal Medicine

## 2013-02-24 ENCOUNTER — Ambulatory Visit (INDEPENDENT_AMBULATORY_CARE_PROVIDER_SITE_OTHER): Payer: BC Managed Care – PPO | Admitting: Internal Medicine

## 2013-02-24 ENCOUNTER — Encounter: Payer: Self-pay | Admitting: Internal Medicine

## 2013-02-24 VITALS — BP 122/80 | HR 70 | Temp 98.8°F | Ht 66.5 in | Wt 174.6 lb

## 2013-02-24 DIAGNOSIS — K5730 Diverticulosis of large intestine without perforation or abscess without bleeding: Secondary | ICD-10-CM

## 2013-02-24 DIAGNOSIS — I10 Essential (primary) hypertension: Secondary | ICD-10-CM

## 2013-02-24 DIAGNOSIS — N138 Other obstructive and reflux uropathy: Secondary | ICD-10-CM

## 2013-02-24 DIAGNOSIS — R109 Unspecified abdominal pain: Secondary | ICD-10-CM

## 2013-02-24 DIAGNOSIS — K589 Irritable bowel syndrome without diarrhea: Secondary | ICD-10-CM | POA: Insufficient documentation

## 2013-02-24 DIAGNOSIS — Z23 Encounter for immunization: Secondary | ICD-10-CM

## 2013-02-24 DIAGNOSIS — N401 Enlarged prostate with lower urinary tract symptoms: Secondary | ICD-10-CM

## 2013-02-24 DIAGNOSIS — E785 Hyperlipidemia, unspecified: Secondary | ICD-10-CM

## 2013-02-24 LAB — BASIC METABOLIC PANEL
BUN: 12 mg/dL (ref 6–23)
CO2: 30 mEq/L (ref 19–32)
Chloride: 99 mEq/L (ref 96–112)
Creatinine, Ser: 1.1 mg/dL (ref 0.4–1.5)
Glucose, Bld: 101 mg/dL — ABNORMAL HIGH (ref 70–99)

## 2013-02-24 LAB — HEPATIC FUNCTION PANEL
ALT: 16 U/L (ref 0–53)
Total Bilirubin: 1.3 mg/dL — ABNORMAL HIGH (ref 0.3–1.2)

## 2013-02-24 LAB — URINALYSIS
Bilirubin Urine: NEGATIVE
Total Protein, Urine: NEGATIVE
Urine Glucose: NEGATIVE

## 2013-02-24 LAB — CBC WITH DIFFERENTIAL/PLATELET
Eosinophils Relative: 2.4 % (ref 0.0–5.0)
HCT: 47.1 % (ref 39.0–52.0)
Lymphs Abs: 1.4 10*3/uL (ref 0.7–4.0)
MCV: 94.8 fl (ref 78.0–100.0)
Monocytes Absolute: 0.7 10*3/uL (ref 0.1–1.0)
Platelets: 287 10*3/uL (ref 150.0–400.0)
RDW: 12.6 % (ref 11.5–14.6)
WBC: 7 10*3/uL (ref 4.5–10.5)

## 2013-02-24 LAB — LIPID PANEL
Cholesterol: 195 mg/dL (ref 0–200)
Triglycerides: 65 mg/dL (ref 0.0–149.0)

## 2013-02-24 NOTE — Patient Instructions (Addendum)
Please remember to go to the lab and x-ray department downstairs for your tests - we will call you with the results when they are available.     Please schedule a follow up visit in 6  months but call sooner if needed   

## 2013-02-24 NOTE — Progress Notes (Signed)
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 He also has moderate obesity with a target weight of around 171 pounds.    HPI Nov 22, 2008 change in bowel habits since changed diet to lots of greens assoc with migratory abd pain. no melena or hematochezia. colonoscopy nl 2003 except for diverticulosis. no wt loss. ? fever this am with no acute worsening in gi symptoms. rx citrcucel/ diet > helped alot with pain but still irreg bm's    January 13, 2009 cpx better except bm still irreg and saw R Arlyce Dice and plan colonoscopy 01/25/09> nl  02/24/2013 f/u ov/Trinity Hyland re multiple dx's/ annual comprehensive eval cc new abd pain migratory, mostly ruq, one month prior to OV  , resolved completely when stress resolved re fm member.  Not  Much aerobics but no cp with exertion, no change bowel habits, no tia or claudication symptoms.  No   chest tightness, subjective wheeze overt sinus or hb symptoms. No unusual exp hx or h/o childhood pna/ asthma or knowledge of premature birth.   Sleeping ok without nocturnal  or early am exacerbation  of respiratory  c/o's or need for noct saba. Also denies any obvious fluctuation of symptoms with weather or environmental changes or other aggravating or alleviating factors except as outlined above   Current Medications, Allergies, Complete Past Medical History, Past Surgical History, Family History, and Social History were reviewed in [employer] record.  ROS  The following are not active complaints unless bolded sore throat, dysphagia, dental problems, itching, sneezing,  nasal congestion or excess/ purulent secretions, ear ache,   fever, chills, sweats, unintended wt loss, pleuritic or exertional cp, hemoptysis,  orthopnea pnd or leg swelling, presyncope,  palpitations, heartburn, abdominal pain, anorexia, nausea, vomiting, diarrhea  or change in bowel or urinary habits, change in stools or urine, dysuria,hematuria,  rash, arthralgias, visual complaints, headache, numbness weakness or ataxia or problems with walking or coordination,  change in mood/affect or memory.                     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)  CHEST PAIN, ATYPICAL (ICD-786.59)  HYPERTENSION (ICD-401.9)  Fair complexion Pos Ak's.......................................................................Marland KitchenTurner  HEALTH MAINTENANCE.........................................................................Marland KitchenWert  - Tdap  02/24/2013  - Pneumovax 01/2012 - CPX 02/24/2013   Family History:  lung cancer in father  ovarian cancer in mother  no siblings  No FH of Colon Cancer   Social History:   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   wt 194 August 09, 2008 > 179 June 29, 2009 >  02/05/2011 175> 02/19/2012  176> 11/06/2012  177 > 02/24/2013  174  HEENT: nl dentition, turbinates, and orophanx. Nl external ear canals without cough reflex  NECK : without JVD/Nodes/TM/ nl carotid upstrokes bilaterally  LUNGS: no acc muscle use, clear to A and P bilaterally without cough on insp or exp maneuvers  CV: RRR no s3 or murmur or increase in  P2, no edema  ABD: soft and nontender with nl excursion in the supine position. No bruits or organomegaly, bowel sounds nl  MS: warm without deformities, calf tenderness, cyanosis or clubbing  Skin fair complexion no lestions GU circ, testes down, no IH Rectal: mild bph, smooth, no nodules, stool G neg  CXR  02/19/2012 : Comparison: 02/05/2011  Findings: Cardiomediastinal silhouette is stable. No acute infiltrate or pleural effusion. No pulmonary edema. Stable minimal dextroscoliosis.  IMPRESSION: No active disease. No significant change.       Assessment:

## 2013-02-24 NOTE — Assessment & Plan Note (Addendum)
-   Target LDL < 130 due to hbp/ male gender/ type A  Lab Results  Component Value Date   CHOL 195 02/24/2013   HDL 77.70 02/24/2013   LDLCALC 104* 02/24/2013   LDLDIRECT 125.0 01/26/2010   TRIG 65.0 02/24/2013   CHOLHDL 3 02/24/2013     Adequate control on present rx, reviewed > no change in rx needed

## 2013-02-24 NOTE — Assessment & Plan Note (Addendum)
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

## 2013-02-25 NOTE — Progress Notes (Signed)
Quick Note:  Spoke with pt and notified of results per Dr. Wert. Pt verbalized understanding and denied any questions.  ______ 

## 2013-02-25 NOTE — Progress Notes (Signed)
Quick Note:  Pt aware ______ 

## 2013-02-26 NOTE — Assessment & Plan Note (Signed)
Adequate control on present rx, reviewed > no change in rx needed   

## 2013-02-26 NOTE — Assessment & Plan Note (Addendum)
-  Diverticulosis by colonoscopy July 2003, repeat January 25, 2009 no change   Reviewed gi w/u and recall policies for f/u

## 2013-02-26 NOTE — Assessment & Plan Note (Signed)
Discussed in detail all the  indications, usual  risks and alternatives  relative to the benefits with patient who agrees to proceed with DRE only

## 2013-05-27 ENCOUNTER — Other Ambulatory Visit: Payer: Self-pay

## 2013-07-07 ENCOUNTER — Telehealth: Payer: Self-pay | Admitting: Internal Medicine

## 2013-07-07 MED ORDER — VALSARTAN-HYDROCHLOROTHIAZIDE 160-12.5 MG PO TABS: 1.0000 | ORAL_TABLET | Freq: Every day | ORAL | Status: DC

## 2013-07-07 NOTE — Telephone Encounter (Signed)
I addressed them and signed new rx to replace benicar for his formulary restrictions

## 2013-07-07 NOTE — Addendum Note (Signed)
Addended by: Christen Butter on: 07/07/2013 05:13 PM   Modules accepted: Orders

## 2013-07-07 NOTE — Telephone Encounter (Signed)
leslie do you have this form? thanks

## 2013-07-07 NOTE — Telephone Encounter (Signed)
Per MW okay to change Benicar to Valsartan HCT 160/12.5 Rx sent to [facility]  Pt's spouse notified and will inform the pt

## 2013-09-21 ENCOUNTER — Telehealth: Payer: Self-pay | Admitting: Internal Medicine

## 2013-09-21 MED ORDER — METOPROLOL SUCCINATE ER 25 MG PO TB24: 25.0000 mg | ORAL_TABLET | Freq: Every day | ORAL | Status: DC

## 2013-09-21 NOTE — Telephone Encounter (Signed)
Rx was sent for metoprolol to [facility]

## 2013-12-01 ENCOUNTER — Ambulatory Visit (INDEPENDENT_AMBULATORY_CARE_PROVIDER_SITE_OTHER): Payer: BC Managed Care – PPO | Admitting: Internal Medicine

## 2013-12-01 ENCOUNTER — Other Ambulatory Visit (INDEPENDENT_AMBULATORY_CARE_PROVIDER_SITE_OTHER): Payer: BC Managed Care – PPO

## 2013-12-01 ENCOUNTER — Encounter: Payer: Self-pay | Admitting: Internal Medicine

## 2013-12-01 ENCOUNTER — Encounter: Payer: Self-pay | Admitting: Gastroenterology

## 2013-12-01 VITALS — BP 140/90 | HR 74 | Temp 98.9°F | Ht 66.0 in | Wt 178.0 lb

## 2013-12-01 DIAGNOSIS — R109 Unspecified abdominal pain: Secondary | ICD-10-CM

## 2013-12-01 LAB — CBC WITH DIFFERENTIAL/PLATELET
BASOS ABS: 0.1 10*3/uL (ref 0.0–0.1)
Basophils Relative: 1.6 % (ref 0.0–3.0)
EOS PCT: 1.4 % (ref 0.0–5.0)
Eosinophils Absolute: 0.1 10*3/uL (ref 0.0–0.7)
HCT: 45.7 % (ref 39.0–52.0)
Hemoglobin: 15.4 g/dL (ref 13.0–17.0)
LYMPHS ABS: 1.6 10*3/uL (ref 0.7–4.0)
LYMPHS PCT: 22.4 % (ref 12.0–46.0)
MCHC: 33.7 g/dL (ref 30.0–36.0)
MCV: 93 fl (ref 78.0–100.0)
MONOS PCT: 10.5 % (ref 3.0–12.0)
Monocytes Absolute: 0.8 10*3/uL (ref 0.1–1.0)
NEUTROS PCT: 64.1 % (ref 43.0–77.0)
Neutro Abs: 4.6 10*3/uL (ref 1.4–7.7)
PLATELETS: 295 10*3/uL (ref 150.0–400.0)
RBC: 4.91 Mil/uL (ref 4.22–5.81)
RDW: 12.9 % (ref 11.5–15.5)
WBC: 7.3 10*3/uL (ref 4.0–10.5)

## 2013-12-01 LAB — URINALYSIS
BILIRUBIN URINE: NEGATIVE
HGB URINE DIPSTICK: NEGATIVE
Ketones, ur: NEGATIVE
Leukocytes, UA: NEGATIVE
NITRITE: NEGATIVE
PH: 6 (ref 5.0–8.0)
Specific Gravity, Urine: 1.015 (ref 1.000–1.030)
TOTAL PROTEIN, URINE-UPE24: NEGATIVE
URINE GLUCOSE: NEGATIVE
Urobilinogen, UA: 0.2 (ref 0.0–1.0)

## 2013-12-01 LAB — HEPATIC FUNCTION PANEL
ALT: 17 U/L (ref 0–53)
AST: 19 U/L (ref 0–37)
Albumin: 4 g/dL (ref 3.5–5.2)
Alkaline Phosphatase: 37 U/L — ABNORMAL LOW (ref 39–117)
BILIRUBIN DIRECT: 0.2 mg/dL (ref 0.0–0.3)
BILIRUBIN TOTAL: 1.1 mg/dL (ref 0.2–1.2)
Total Protein: 6.5 g/dL (ref 6.0–8.3)

## 2013-12-01 NOTE — Patient Instructions (Addendum)
Please see patient coordinator before you leave today  to schedule abd u/s and GI eval by Arlyce DiceKaplan  Pepcid 20 mg after bfast and supper   Treatment consists of avoiding foods that cause gas (especially beans and raw vegetables like spinach and salads and boiled eggs)  and citrucel 1 heaping tsp twice daily with a large glass of water.    For pain > gaviscon liquid as needed

## 2013-12-01 NOTE — Progress Notes (Signed)
Quick Note:  Spoke with pt and notified of results per Dr. Wert. Pt verbalized understanding and denied any questions.  ______ 

## 2013-12-01 NOTE — Progress Notes (Signed)
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 He also has moderate obesity with a target weight of around 171 pounds.    HPI Nov 22, 2008 change in bowel habits since changed diet to lots of greens assoc with migratory abd pain. no melena or hematochezia. colonoscopy nl 2003 except for diverticulosis. no wt loss. ? fever this am with no acute worsening in gi symptoms. rx citrcucel/ diet > helped alot with pain but still irreg bm's    January 13, 2009 cpx better except bm still irreg and saw Colin Palmer and plan colonoscopy 01/25/09> nl  02/24/2013 f/u ov/Colin Palmer re multiple dx's/ annual comprehensive eval cc new abd pain migratory, mostly ruq, one month prior to OV  , resolved completely when stress resolved re fm member. Not  Much aerobics but no cp with exertion, no change bowel habits, no tia or claudication symptoms. rec     12/01/2013 f/u ov/Colin Palmer re: recurrent ruq pain on citrucel  Chief Complaint  Patient presents with  . Acute Visit    Pt c/o lower abd pain for approx 3 wks. He states that pain is not sharp and comes and goes. He denies any nausea, vomiting, diarrhea, constipation or fever.     Helps to eat but  not better p tums , admits to stress and seems worse with spicy or [place] foods.  Pain is somewhat migratory but worse in RUQ does not wake him from sleep. No change in bowel/ bladder habits    No   chest tightness, subjective wheeze overt sinus or hb symptoms. No unusual exp hx or h/o childhood pna/ asthma or knowledge of premature birth.   Sleeping ok without nocturnal  or early am exacerbation  of respiratory  c/o's or need for noct saba. Also denies any obvious fluctuation of symptoms with weather or environmental changes or other aggravating or alleviating factors except as outlined  above   Current Medications, Allergies, Complete Past Medical History, Past Surgical History, Family History, and Social History were reviewed in Colin Palmer.  ROS  The following are not active complaints unless bolded sore throat, dysphagia, dental problems, itching, sneezing,  nasal congestion or excess/ purulent secretions, ear ache,   fever, chills, sweats, unintended wt loss, pleuritic or exertional cp, hemoptysis,  orthopnea pnd or leg swelling, presyncope, palpitations, heartburn, abdominal pain, anorexia, nausea, vomiting, diarrhea  or change in bowel or urinary habits, change in stools or urine, dysuria,hematuria,  rash, arthralgias, visual complaints, headache, numbness weakness or ataxia or problems with walking or coordination,  change in mood/affect or memory.                     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)  CHEST PAIN, ATYPICAL (ICD-786.59)  HYPERTENSION (ICD-401.9)  Fair complexion Pos Ak's.......................................................................Colin Palmer  HEALTH MAINTENANCE.........................................................................Colin Kitchen.Colin Palmer  02/24/2013  - Palmer 01/2012 - CPX 02/24/2013   Family  History:  lung cancer in father  ovarian cancer in mother  no siblings  No FH of Colon Cancer   Social History:   denies ever smoking  works as Pensions consultantattorney very stressful  Alcohol Use - yes: one glass of wine daily  Illicit Drug Use - no  Daily Caffeine Use: one cup of coffee daily              Objective:   Physical Exam  ambulatory healthy appearing white male in no distress.   wt 194 August 09, 2008 > 179 June 29, 2009 >  02/05/2011 175> 02/19/2012  176> 11/06/2012  177 > 02/24/2013  174> 12/01/2013 178   HEENT: nl dentition, turbinates, and orophanx. Nl external ear canals without cough reflex  NECK : without JVD/Nodes/TM/ nl carotid upstrokes bilaterally  LUNGS: no acc muscle use, clear to A and P bilaterally without cough on insp or exp maneuvers  CV: RRR no s3 or murmur or increase in P2, no edema  ABD: soft and nontender with nl excursion in the supine position. No bruits or organomegaly, bowel sounds nl  MS: warm without deformities, calf tenderness, cyanosis or clubbing  Skin fair complexion no lestions        Lab Results  Component Value Date   WBC 7.3 12/01/2013   HGB 15.4 12/01/2013   HCT 45.7 12/01/2013   MCV 93.0 12/01/2013   PLT 295.0 12/01/2013         Lab Results  Component Value Date   ALT 17 12/01/2013   AST 19 12/01/2013   ALKPHOS 37* 12/01/2013   BILITOT 1.1 12/01/2013       U/A also neg 12/01/13   Assessment:

## 2013-12-02 ENCOUNTER — Ambulatory Visit (HOSPITAL_COMMUNITY)
Admission: RE | Admit: 2013-12-02 | Discharge: 2013-12-02 | Disposition: A | Discharge status: Home / Self Care | Payer: BC Managed Care – PPO | Source: Ambulatory Visit | Attending: Internal Medicine | Admitting: Internal Medicine

## 2013-12-02 ENCOUNTER — Encounter: Payer: Self-pay | Admitting: Internal Medicine

## 2013-12-02 DIAGNOSIS — R109 Unspecified abdominal pain: Secondary | ICD-10-CM

## 2013-12-02 DIAGNOSIS — R1011 Right upper quadrant pain: Secondary | ICD-10-CM | POA: Insufficient documentation

## 2013-12-03 ENCOUNTER — Encounter: Payer: Self-pay | Admitting: *Deleted

## 2013-12-03 NOTE — Assessment & Plan Note (Addendum)
abd u/s has returned neg except for excess gas so this is probably IBS vs PUD  Classic subdiaphragmatic pain pattern suggests ibs:  Stereotypical, migratory with a very limited distribution of pain locations, daytime, not exacerbated by ex or coughing, worse in sitting position, associated with generalized abd bloating, not present supine due to the dome effect of the diaphragm is  canceled in that position. Frequently these patients have had multiple negative GI workups and CT scans.  Treatment consists of avoiding foods that cause gas (especially beans and raw vegetables like spinach and salads)  and citrucel 1 heaping tsp twice daily with a large glass of water.     Add pepcid 20 mg bid  And gaviscon  pending f/u by GI (referred)

## 2013-12-07 ENCOUNTER — Ambulatory Visit (INDEPENDENT_AMBULATORY_CARE_PROVIDER_SITE_OTHER): Payer: BC Managed Care – PPO | Admitting: Gastroenterology

## 2013-12-07 ENCOUNTER — Encounter: Payer: Self-pay | Admitting: Gastroenterology

## 2013-12-07 VITALS — BP 120/82 | HR 80 | Ht 66.0 in | Wt 175.0 lb

## 2013-12-07 DIAGNOSIS — R1011 Right upper quadrant pain: Secondary | ICD-10-CM | POA: Insufficient documentation

## 2013-12-07 NOTE — Progress Notes (Signed)
12/07/2013 Colin Palmer [id number] 04/23/1946   HISTORY OF PRESENT ILLNESS:  This is a pleasant 68 year old male who is previously known to Dr. Arlyce DiceKaplan for colonoscopy. His last colonoscopy was in July 2010 at which time he was found to have mild diverticulosis in the sigmoid colon and repeat was recommended in 10 years from that time. He presents to our office today at the request of Dr. Sherene SiresWert for complaints of right upper quadrant abdominal pain. He states that it began approximately 1 month ago. He says it comes and goes, but is not affected by eating. He describes it as a "stitch or a catch" in his right upper quadrant. An abdominal ultrasound was performed last week and was unremarkable. Labs including a CBC, hepatic function panel, and urinalysis were unremarkable. He denies any other associated complaints. He states that the pain began after he had been coughing for some time and thought that it was a pulled muscle, but it was persistent so he decided to seek evaluation. He states that it is actually improving. He was placed on Pepcid 20 mg twice daily just recently by Dr. Sherene SiresWert, but denies any complaints of heartburn/reflux.  He has Advil 200 mg listed on his medication list but says that he only takes that periodically and has not even taken it in a couple of weeks.   Past Medical History  Diagnosis Date  . Overweight   . Diverticulosis of colon (without mention of hemorrhage) 2010,2003  . Hyperlipidemia   . Tension headache   . PND (post-nasal drip)   . Atypical chest pain   . Hypertrophy of prostate with urinary obstruction and other lower urinary tract symptoms (LUTS)   . Hypertension    Past Surgical History  Procedure Laterality Date  . Tonsillectomy    . Colonoscopy  2010, 2003    562.10    reports that he has never smoked. He has never used smokeless tobacco. He reports that he drinks about 3.5 ounces of alcohol per week. He reports that he does not use illicit drugs. family  history includes Lung cancer in his father; Ovarian cancer in his mother. No Known Allergies    Outpatient Encounter Prescriptions as of 12/07/2013  Medication Sig  . aspirin 325 MG tablet Take 325 mg by mouth daily.    . calcium carbonate (TUMS - DOSED IN MG ELEMENTAL CALCIUM) 500 MG chewable tablet Chew 1 tablet by mouth daily as needed.    . famotidine (PEPCID) 20 MG tablet Take 20 mg by mouth 2 (two) times daily.  Marland Kitchen. FINACEA 15 % cream As directed  . ibuprofen (ADVIL) 200 MG tablet Take 200 mg by mouth every 6 (six) hours as needed.    . methylcellulose (CITRUCEL) oral powder Take 1 packet by mouth daily.    . metoprolol succinate (TOPROL-XL) 25 MG 24 hr tablet Take 1 tablet (25 mg total) by mouth daily.  . Omega-3 Fatty Acids (FISH OIL) 1000 MG CAPS Take 1 capsule by mouth 3 (three) times daily.   [name] Polyethyl Glycol-Propyl Glycol (SYSTANE OP) Apply 3-4 drops to eye daily.  . valsartan-hydrochlorothiazide (DIOVAN-HCT) 160-12.5 MG per tablet Take 1 tablet by mouth daily.     REVIEW OF SYSTEMS  : All other systems reviewed and negative except where noted in the History of Present Illness.   PHYSICAL EXAM: BP 120/82  Pulse 80  Ht 5\' 6"  (1.676 m)  Wt 175 lb (79.379 kg)  BMI 28.26 kg/m2 General: Well developed white male in  no acute distress Head: Normocephalic and atraumatic Eyes:  Sclerae anicteric, conjunctiva pink. Ears: Normal auditory acuity Lungs: Clear throughout to auscultation; minimal TTP over lower right rib cage Heart: Regular rate and rhythm Abdomen: Soft, non-distended.  Normal bowel sounds.  Non-tender. Musculoskeletal: Symmetrical with no gross deformities  Skin: No lesions on visible extremities Extremities: No edema  Neurological: Alert oriented x 4, grossly non-focal Psychological:  Alert and cooperative. Normal mood and affect  ASSESSMENT AND PLAN: -RUQ abdominal pain:  Actually seems to be over the right rib cage.  ? Costochondritis vs muscle strain from  coughing previously.  Will just monitor for now.  Will call back if it does not resolve for worsens, but I do not think that this is necessarily GI related.

## 2013-12-07 NOTE — Patient Instructions (Signed)
Please follow up as needed 

## 2013-12-07 NOTE — Progress Notes (Signed)
Reviewed and agree with management. Justise Ehmann D. Korrine Sicard, M.D., FACG  

## 2014-05-18 ENCOUNTER — Ambulatory Visit: Payer: BC Managed Care – PPO | Admitting: Internal Medicine

## 2014-05-26 ENCOUNTER — Encounter: Payer: Self-pay | Admitting: Internal Medicine

## 2014-05-26 ENCOUNTER — Other Ambulatory Visit (INDEPENDENT_AMBULATORY_CARE_PROVIDER_SITE_OTHER): Payer: BC Managed Care – PPO

## 2014-05-26 ENCOUNTER — Ambulatory Visit (INDEPENDENT_AMBULATORY_CARE_PROVIDER_SITE_OTHER)
Admission: RE | Admit: 2014-05-26 | Discharge: 2014-05-26 | Disposition: A | Discharge status: Home / Self Care | Payer: BC Managed Care – PPO | Source: Ambulatory Visit | Attending: Internal Medicine | Admitting: Internal Medicine

## 2014-05-26 ENCOUNTER — Ambulatory Visit (INDEPENDENT_AMBULATORY_CARE_PROVIDER_SITE_OTHER): Payer: BC Managed Care – PPO | Admitting: Internal Medicine

## 2014-05-26 VITALS — BP 132/86 | HR 70 | Ht 66.0 in | Wt 178.0 lb

## 2014-05-26 DIAGNOSIS — I10 Essential (primary) hypertension: Secondary | ICD-10-CM

## 2014-05-26 DIAGNOSIS — E785 Hyperlipidemia, unspecified: Secondary | ICD-10-CM

## 2014-05-26 DIAGNOSIS — Z0000 Encounter for general adult medical examination without abnormal findings: Secondary | ICD-10-CM

## 2014-05-26 DIAGNOSIS — Z23 Encounter for immunization: Secondary | ICD-10-CM

## 2014-05-26 DIAGNOSIS — N400 Enlarged prostate without lower urinary tract symptoms: Secondary | ICD-10-CM

## 2014-05-26 LAB — LIPID PANEL
CHOL/HDL RATIO: 3
CHOLESTEROL: 203 mg/dL — AB (ref 0–200)
HDL: 64.4 mg/dL (ref >39.00)
LDL Cholesterol: 128 mg/dL — ABNORMAL HIGH (ref 0–99)
NonHDL: 138.6
TRIGLYCERIDES: 51 mg/dL (ref 0.0–149.0)
VLDL: 10.2 mg/dL (ref 0.0–40.0)

## 2014-05-26 LAB — URINALYSIS
Bilirubin Urine: NEGATIVE
Hgb urine dipstick: NEGATIVE
Ketones, ur: NEGATIVE
Leukocytes, UA: NEGATIVE
Nitrite: NEGATIVE
PH: 6.5 (ref 5.0–8.0)
SPECIFIC GRAVITY, URINE: 1.015 (ref 1.000–1.030)
TOTAL PROTEIN, URINE-UPE24: NEGATIVE
URINE GLUCOSE: NEGATIVE
UROBILINOGEN UA: 0.2 (ref 0.0–1.0)

## 2014-05-26 LAB — TSH: TSH: 0.67 u[IU]/mL (ref 0.35–4.50)

## 2014-05-26 LAB — CBC WITH DIFFERENTIAL/PLATELET
BASOS ABS: 0 10*3/uL (ref 0.0–0.1)
BASOS PCT: 0.6 % (ref 0.0–3.0)
EOS ABS: 0.1 10*3/uL (ref 0.0–0.7)
Eosinophils Relative: 1.3 % (ref 0.0–5.0)
HCT: 48.2 % (ref 39.0–52.0)
HEMOGLOBIN: 16.4 g/dL (ref 13.0–17.0)
Lymphocytes Relative: 21 % (ref 12.0–46.0)
Lymphs Abs: 1.5 10*3/uL (ref 0.7–4.0)
MCHC: 34 g/dL (ref 30.0–36.0)
MCV: 93 fl (ref 78.0–100.0)
MONO ABS: 0.7 10*3/uL (ref 0.1–1.0)
Monocytes Relative: 10 % (ref 3.0–12.0)
NEUTROS ABS: 4.7 10*3/uL (ref 1.4–7.7)
Neutrophils Relative %: 67.1 % (ref 43.0–77.0)
Platelets: 342 10*3/uL (ref 150.0–400.0)
RBC: 5.19 Mil/uL (ref 4.22–5.81)
RDW: 12.5 % (ref 11.5–15.5)
WBC: 6.9 10*3/uL (ref 4.0–10.5)

## 2014-05-26 LAB — BASIC METABOLIC PANEL
BUN: 12 mg/dL (ref 6–23)
CHLORIDE: 97 meq/L (ref 96–112)
CO2: 29 meq/L (ref 19–32)
CREATININE: 1.1 mg/dL (ref 0.4–1.5)
Calcium: 9.3 mg/dL (ref 8.4–10.5)
GFR: 69.23 mL/min (ref >60.00)
GLUCOSE: 104 mg/dL — AB (ref 70–99)
Potassium: 4.4 mEq/L (ref 3.5–5.1)
SODIUM: 134 meq/L — AB (ref 135–145)

## 2014-05-26 LAB — HEPATIC FUNCTION PANEL
ALBUMIN: 3.6 g/dL (ref 3.5–5.2)
ALT: 18 U/L (ref 0–53)
AST: 19 U/L (ref 0–37)
Alkaline Phosphatase: 46 U/L (ref 39–117)
Bilirubin, Direct: 0.2 mg/dL (ref 0.0–0.3)
TOTAL PROTEIN: 7.2 g/dL (ref 6.0–8.3)
Total Bilirubin: 0.9 mg/dL (ref 0.2–1.2)

## 2014-05-26 NOTE — Patient Instructions (Signed)
Prevnar today should be your last pneumonia shot   Avoid beans, undercooked vegetables and increase the citrucel to twice daily with large glass of water   Please remember to go to the lab and x-ray department downstairs for your tests - we will call you with the results when they are available.

## 2014-05-26 NOTE — Progress Notes (Signed)
Quick Note:  LMTCB ______ 

## 2014-05-26 NOTE — Progress Notes (Signed)
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 He also has moderate obesity with a target weight of around 171 pounds.    HPI Nov 22, 2008 change in bowel habits since changed diet to lots of greens assoc with migratory abd pain. no melena or hematochezia. colonoscopy nl 2003 except for diverticulosis. no wt loss. ? fever this am with no acute worsening in gi symptoms. rx citrcucel/ diet > helped alot with pain but still irreg bm's    January 13, 2009 cpx better except bm still irreg and saw R Arlyce DiceKaplan and plan colonoscopy 01/25/09> nl  02/24/2013 f/u ov/Amariyah Bazar re multiple dx's/ annual comprehensive eval cc new abd pain migratory, mostly ruq, one month prior to OV  , resolved completely when stress resolved re fm member. Not  Much aerobics but no cp with exertion, no change bowel habits, no tia or claudication symptoms. rec rx as IBS  GI w/u with abd u/s 12/02/13 neg / Dr Arlyce DiceKaplan imp mscp from cough           05/26/2014 f/u ov/Kayman Snuffer re: cpx  Chief Complaint  Patient presents with  . Annual Exam    Pt is fasting. He states doing well and denies any co's today.     Ex -= eliptical 4x weekly s cp or claudication/ tia / still working full time  RUQ pain better but not really compliant with citrucel or diet/ GI w/u neg    No  chest tightness, subjective wheeze overt sinus or hb symptoms. No unusual exp hx or h/o childhood pna/ asthma or knowledge of premature birth.   Sleeping ok without nocturnal  or early am exacerbation  of respiratory  c/o's or need for noct saba. Also denies any obvious fluctuation of symptoms with weather or environmental changes or other aggravating or alleviating factors except as outlined above   Current Medications, Allergies, Complete Past Medical History, Past Surgical  History, Family History, and Social History were reviewed in [facility] electronic medical record.  ROS  The following are not active complaints unless bolded sore throat, dysphagia, dental problems, itching, sneezing,  nasal congestion or excess/ purulent secretions, ear ache,   fever, chills, sweats, unintended wt loss, pleuritic or exertional cp, hemoptysis,  orthopnea pnd or leg swelling, presyncope, palpitations, heartburn, abdominal pain, anorexia, nausea, vomiting, diarrhea  or change in bowel or urinary habits, change in stools or urine, dysuria,hematuria,  rash, arthralgias, visual complaints, headache, numbness weakness or ataxia or problems with walking or coordination,  change in mood/affect or memory.                     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)  CHEST PAIN, ATYPICAL (ICD-786.59)  HYPERTENSION (ICD-401.9)  Fair complexion Pos Ak's.......................................................................Marland Kitchen.[name]  HEALTH MAINTENANCE.........................................................................Marland Kitchen.Derrick Tiegs  - Tdap  02/24/2013  - Pneumovax 01/2012,  05/26/2014  Prevar - CPX 05/26/2014     Family History:  lung cancer  in father  ovarian cancer in mother  no siblings  No FH of Colon Cancer   Social History:  denies ever smoking  works as Pensions consultantattorney very stressful  Alcohol Use - yes: one glass of wine daily  Illicit Drug Use - no  Daily Caffeine Use: one cup of coffee daily             Objective:   Physical Exam  ambulatory healthy  appearing white male in no distress.   Vital signs reviewed.  wt 194 August 09, 2008 > 179 June 29, 2009 >  02/05/2011 175> 02/19/2012  176> 11/06/2012  177 > 02/24/2013  174> 12/01/2013 178  > 05/26/2014 178  HEENT: nl dentition, turbinates, and orophanx. Nl external ear canals without cough reflex  NECK : without JVD/Nodes/TM/ nl carotid upstrokes bilaterally  LUNGS: no acc muscle use, clear to A and P bilaterally without cough on insp or exp maneuvers  CV: RRR no s3 or murmur or increase in P2, no edema  ABD: soft and nontender with nl excursion in the supine position. No bruits or organomegaly, bowel sounds nl  MS: warm without deformities, calf tenderness, cyanosis or clubbing  Skin fair complexion no lestions GU testes down, no nodules or IH Rectal mild bph, smooth texture, no nodules, stool g neg    CXR  05/26/2014 :  No active cardiopulmonary disease.   Recent Labs Lab 05/26/14 0922  NA 134*  K 4.4  CL 97  CO2 29  BUN 12  CREATININE 1.1  GLUCOSE 104*    Recent Labs Lab 05/26/14 0922  HGB 16.4  HCT 48.2  WBC 6.9  PLT 342.0     Lab Results  Component Value Date   TSH 0.67 05/26/2014               Assessment:

## 2014-05-27 ENCOUNTER — Telehealth: Payer: Self-pay | Admitting: Internal Medicine

## 2014-05-27 MED ORDER — VALSARTAN-HYDROCHLOROTHIAZIDE 160-12.5 MG PO TABS: 1.0000 | ORAL_TABLET | Freq: Every day | ORAL | Status: DC

## 2014-05-27 NOTE — Progress Notes (Signed)
Quick Note:  Pt aware ______ 

## 2014-05-27 NOTE — Telephone Encounter (Signed)
I called and spoke with the pt and notified of lab and cxr results Nothing further needed

## 2014-05-29 NOTE — Assessment & Plan Note (Signed)
Exam is very benign, neg fm hx/ caucasion so post probability very low       - Declined psa screening 02/18/2012 and 02/24/13 and 05/26/14

## 2014-05-29 NOTE — Assessment & Plan Note (Signed)
Reviewed > prevnar given

## 2014-05-29 NOTE — Assessment & Plan Note (Signed)
Lab Results  Component Value Date   CHOL 203* 05/26/2014   HDL 64.40 05/26/2014   LDLCALC 128* 05/26/2014   LDLDIRECT 125.0 01/26/2010   TRIG 51.0 05/26/2014   CHOLHDL 3 05/26/2014     Adequate control on present rx, reviewed > no change in rx needed

## 2014-05-29 NOTE — Assessment & Plan Note (Signed)
Adequate control on present rx, reviewed > no change in rx needed   

## 2014-09-21 ENCOUNTER — Telehealth: Payer: Self-pay | Admitting: Internal Medicine

## 2014-09-21 MED ORDER — METOPROLOL SUCCINATE ER 25 MG PO TB24: 25.0000 mg | ORAL_TABLET | Freq: Every day | ORAL | Status: DC

## 2014-09-21 NOTE — Telephone Encounter (Signed)
[facility]

## 2015-05-30 ENCOUNTER — Other Ambulatory Visit: Payer: Self-pay | Admitting: Internal Medicine

## 2015-07-03 ENCOUNTER — Ambulatory Visit (INDEPENDENT_AMBULATORY_CARE_PROVIDER_SITE_OTHER)
Admission: RE | Admit: 2015-07-03 | Discharge: 2015-07-03 | Disposition: A | Discharge status: Home / Self Care | Payer: BLUE CROSS/BLUE SHIELD | Source: Ambulatory Visit | Attending: Internal Medicine | Admitting: Internal Medicine

## 2015-07-03 ENCOUNTER — Encounter: Payer: Self-pay | Admitting: Internal Medicine

## 2015-07-03 ENCOUNTER — Other Ambulatory Visit (INDEPENDENT_AMBULATORY_CARE_PROVIDER_SITE_OTHER): Payer: BLUE CROSS/BLUE SHIELD

## 2015-07-03 ENCOUNTER — Ambulatory Visit (INDEPENDENT_AMBULATORY_CARE_PROVIDER_SITE_OTHER): Payer: BLUE CROSS/BLUE SHIELD | Admitting: Internal Medicine

## 2015-07-03 VITALS — BP 132/84 | HR 70 | Ht 67.5 in | Wt 183.5 lb

## 2015-07-03 DIAGNOSIS — I10 Essential (primary) hypertension: Secondary | ICD-10-CM | POA: Diagnosis not present

## 2015-07-03 DIAGNOSIS — E785 Hyperlipidemia, unspecified: Secondary | ICD-10-CM | POA: Diagnosis not present

## 2015-07-03 DIAGNOSIS — Z0000 Encounter for general adult medical examination without abnormal findings: Secondary | ICD-10-CM

## 2015-07-03 DIAGNOSIS — K589 Irritable bowel syndrome without diarrhea: Secondary | ICD-10-CM

## 2015-07-03 DIAGNOSIS — K5730 Diverticulosis of large intestine without perforation or abscess without bleeding: Secondary | ICD-10-CM

## 2015-07-03 LAB — CBC WITH DIFFERENTIAL/PLATELET
BASOS PCT: 0.7 % (ref 0.0–3.0)
Basophils Absolute: 0.1 10*3/uL (ref 0.0–0.1)
EOS PCT: 1.4 % (ref 0.0–5.0)
Eosinophils Absolute: 0.1 10*3/uL (ref 0.0–0.7)
HCT: 46 % (ref 39.0–52.0)
Hemoglobin: 15.5 g/dL (ref 13.0–17.0)
LYMPHS ABS: 1.7 10*3/uL (ref 0.7–4.0)
Lymphocytes Relative: 19.4 % (ref 12.0–46.0)
MCHC: 33.7 g/dL (ref 30.0–36.0)
MCV: 93.2 fl (ref 78.0–100.0)
MONO ABS: 0.9 10*3/uL (ref 0.1–1.0)
MONOS PCT: 10.1 % (ref 3.0–12.0)
NEUTROS PCT: 68.4 % (ref 43.0–77.0)
Neutro Abs: 6 10*3/uL (ref 1.4–7.7)
Platelets: 328 10*3/uL (ref 150.0–400.0)
RBC: 4.93 Mil/uL (ref 4.22–5.81)
RDW: 12.8 % (ref 11.5–15.5)
WBC: 8.7 10*3/uL (ref 4.0–10.5)

## 2015-07-03 LAB — HEPATIC FUNCTION PANEL
ALBUMIN: 4.3 g/dL (ref 3.5–5.2)
ALT: 17 U/L (ref 0–53)
AST: 18 U/L (ref 0–37)
Alkaline Phosphatase: 44 U/L (ref 39–117)
BILIRUBIN TOTAL: 0.9 mg/dL (ref 0.2–1.2)
Bilirubin, Direct: 0.1 mg/dL (ref 0.0–0.3)
Total Protein: 6.7 g/dL (ref 6.0–8.3)

## 2015-07-03 LAB — BASIC METABOLIC PANEL
BUN: 15 mg/dL (ref 6–23)
CHLORIDE: 98 meq/L (ref 96–112)
CO2: 31 meq/L (ref 19–32)
Calcium: 9.3 mg/dL (ref 8.4–10.5)
Creatinine, Ser: 1.14 mg/dL (ref 0.40–1.50)
GFR: 67.61 mL/min (ref >60.00)
GLUCOSE: 102 mg/dL — AB (ref 70–99)
POTASSIUM: 4.3 meq/L (ref 3.5–5.1)
SODIUM: 135 meq/L (ref 135–145)

## 2015-07-03 LAB — LIPID PANEL
CHOLESTEROL: 185 mg/dL (ref 0–200)
HDL: 72.3 mg/dL (ref >39.00)
LDL CALC: 100 mg/dL — AB (ref 0–99)
NonHDL: 112.88
TRIGLYCERIDES: 64 mg/dL (ref 0.0–149.0)
Total CHOL/HDL Ratio: 3
VLDL: 12.8 mg/dL (ref 0.0–40.0)

## 2015-07-03 LAB — URINALYSIS
BILIRUBIN URINE: NEGATIVE
Ketones, ur: NEGATIVE
LEUKOCYTES UA: NEGATIVE
NITRITE: NEGATIVE
Specific Gravity, Urine: 1.015 (ref 1.000–1.030)
Total Protein, Urine: NEGATIVE
Urine Glucose: NEGATIVE
Urobilinogen, UA: 0.2 (ref 0.0–1.0)
pH: 7 (ref 5.0–8.0)

## 2015-07-03 LAB — TSH: TSH: 0.87 u[IU]/mL (ref 0.35–4.50)

## 2015-07-03 MED ORDER — VALSARTAN-HYDROCHLOROTHIAZIDE 160-12.5 MG PO TABS: 1.0000 | ORAL_TABLET | Freq: Every day | ORAL | Status: DC

## 2015-07-03 MED ORDER — METOPROLOL SUCCINATE ER 25 MG PO TB24: 25.0000 mg | ORAL_TABLET | Freq: Every day | ORAL | Status: DC

## 2015-07-03 NOTE — Progress Notes (Signed)
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 He also has moderate obesity with a target weight of around 171 pounds.    History of Present Illness  Nov 22, 2008 change in bowel habits since changed diet to lots of greens assoc with migratory abd pain. no melena or hematochezia. colonoscopy nl 2003 except for diverticulosis. no wt loss. ? fever this am with no acute worsening in gi symptoms. rx citrcucel/ diet > helped alot with pain but still irreg bm's    January 13, 2009 cpx better except bm still irreg and saw Colin Palmer and plan colonoscopy 01/25/09> nl  02/24/2013 f/u ov/[name] re multiple dx's/ annual comprehensive eval cc new abd pain migratory, mostly ruq, one month prior to OV  , resolved completely when stress resolved re fm member. Not  Much aerobics but no cp with exertion, no change bowel habits, no tia or claudication symptoms. rec rx as IBS  GI w/u with abd u/s 12/02/13 neg / Dr Colin Palmer imp mscp from cough    05/26/2014 f/u ov/Colin Palmer re: cpx  Chief Complaint  Patient presents with  . Annual Exam    Pt is fasting. He states doing well and denies any co's today.   Ex -= eliptical 4x weekly s cp or claudication/ tia / still working full time  RUQ pain better but not really compliant with citrucel or diet/ GI w/u neg  rec Prevnar today should be your last pneumonia shot  Avoid beans, undercooked vegetables and increase the citrucel to twice daily with large glass of water   . 07/03/2015  f/u ov/Colin Palmer re: cpx  Chief Complaint  Patient presents with  . Annual Exam    Pt is fasting. 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   ex daily  s cp/ claudication/ sob    No  chest tightness, subjective wheeze overt sinus or hb symptoms. No unusual exp hx or h/o childhood pna/ asthma  or knowledge of premature birth.   Sleeping ok without nocturnal  or early am exacerbation  of respiratory  c/o's or need for noct saba. Also denies any obvious fluctuation of symptoms with weather or environmental changes or other aggravating or alleviating factors except as outlined above   Current Medications, Allergies, Complete Past Medical History, Past Surgical History, Family History, and Social History were reviewed in Colin Palmer electronic medical record.  ROS  The following are not active complaints unless bolded sore throat, dysphagia, dental problems, itching, sneezing,  nasal congestion or excess/ purulent secretions, ear ache,   fever, chills, sweats, unintended wt loss, pleuritic or exertional cp, hemoptysis,  orthopnea pnd or leg swelling, presyncope, palpitations, heartburn, abdominal pain rarely - on citrucel daily  anorexia, nausea, vomiting, diarrhea  or change in bowel or urinary habits, change in stools or urine, dysuria,hematuria,  rash, arthralgias, visual complaints, headache, numbness weakness or ataxia or problems with walking or coordination,  change in mood/affect or memory.                     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)  CHEST PAIN, ATYPICAL (ICD-786.59)  HYPERTENSION (ICD-401.9)  Fair complexion Pos Ak's.......................................................................Colin Palmer  HEALTH MAINTENANCE.........................................................................Colin KitchenWert  - Tdap  02/24/2013  - Pneumovax 01/2012,  05/26/2014   Prevar - CPX 07/03/2015     Family History:  lung cancer in father  ovarian cancer in mother  no siblings  No FH of Colon Cancer   Social History:  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   Vital signs reviewed.  wt 194 August 09, 2008 > 179 June 29, 2009 >  02/05/2011 175> 02/19/2012  176> 11/06/2012  177 > 02/24/2013  174> 12/01/2013 178  > 05/26/2014 178 > 07/03/2015   183   HEENT: nl dentition, turbinates, and orophanx. Nl external ear canals without cough reflex  NECK : without JVD/Nodes/TM/ nl carotid upstrokes bilaterally  LUNGS: no acc muscle use, clear to A and P bilaterally without cough on insp or exp maneuvers  CV: RRR no s3 or murmur or increase in P2, no edema  ABD: soft and nontender with nl excursion in the supine position. No bruits or organomegaly, bowel sounds nl  MS: warm without deformities, calf tenderness, cyanosis or clubbing  Skin fair complexion/ a few sk's over back  GU testes down, no nodules or IH Rectal mild bph, smooth texture, no nodules, stool g neg           CXR PA and Lateral:   07/03/2015 :    I personally reviewed images and agree with radiology impression as follows:   No acute abnormality seen.  Labs ordered/ reviewed:   Lab 07/03/15 1029  NA 135  K 4.3  CL 98  CO2 31  BUN 15  CREATININE 1.14  GLUCOSE 102*   Lab 07/03/15 1029  HGB 15.5  HCT 46.0  WBC 8.7  PLT 328.0   Lab Results  Component Value Date   TSH 0.87 07/03/2015                       Assessment:

## 2015-07-03 NOTE — Patient Instructions (Signed)
Please remember to go to the lab and x-ray department downstairs for your tests - we will call you with the results when they are available.     Please schedule a follow up visit in 12  months but call sooner if needed  

## 2015-07-04 NOTE — Progress Notes (Signed)
Quick Note:  Spoke with pt and notified of results per Dr. Wert. Pt verbalized understanding and denied any questions.  ______ 

## 2015-07-06 ENCOUNTER — Encounter: Payer: Self-pay | Admitting: Internal Medicine

## 2015-07-06 NOTE — Assessment & Plan Note (Signed)
Adequate control on present rx, reviewed > no change in rx needed   

## 2015-07-06 NOTE — Assessment & Plan Note (Addendum)
-   Target LDL < 130 due to hbp/ male gender/ type A   Lab Results  Component Value Date   CHOL 185 07/03/2015   HDL 72.30 07/03/2015   LDLCALC 100* 07/03/2015   LDLDIRECT 125.0 01/26/2010   TRIG 64.0 07/03/2015   CHOLHDL 3 07/03/2015    Adequate control on present rx, reviewed > no change in rx needed  = diet/ ex

## 2015-07-06 NOTE — Assessment & Plan Note (Signed)
-   abd u/s neg 12/02/2013 x lots of gas > better with citrucel  - Referred to GI 12/01/13 > no w/u warranted

## 2015-07-06 NOTE — Assessment & Plan Note (Signed)
F/u [facility] -Diverticulosis by colonoscopy July 2003, repeat January 25, 2009 no change

## 2016-05-01 ENCOUNTER — Other Ambulatory Visit: Payer: Self-pay

## 2016-06-17 ENCOUNTER — Other Ambulatory Visit: Payer: Self-pay | Admitting: Internal Medicine

## 2016-07-02 ENCOUNTER — Other Ambulatory Visit (INDEPENDENT_AMBULATORY_CARE_PROVIDER_SITE_OTHER): Payer: Medicare Other

## 2016-07-02 ENCOUNTER — Ambulatory Visit (INDEPENDENT_AMBULATORY_CARE_PROVIDER_SITE_OTHER)
Admission: RE | Admit: 2016-07-02 | Discharge: 2016-07-02 | Disposition: A | Discharge status: Home / Self Care | Payer: Medicare Other | Source: Ambulatory Visit | Attending: Internal Medicine | Admitting: Internal Medicine

## 2016-07-02 ENCOUNTER — Ambulatory Visit (INDEPENDENT_AMBULATORY_CARE_PROVIDER_SITE_OTHER): Payer: Medicare Other | Admitting: Internal Medicine

## 2016-07-02 ENCOUNTER — Encounter: Payer: Self-pay | Admitting: Internal Medicine

## 2016-07-02 VITALS — BP 134/82 | HR 75 | Ht 67.0 in | Wt 181.0 lb

## 2016-07-02 DIAGNOSIS — E785 Hyperlipidemia, unspecified: Secondary | ICD-10-CM | POA: Diagnosis not present

## 2016-07-02 DIAGNOSIS — R1011 Right upper quadrant pain: Secondary | ICD-10-CM | POA: Diagnosis not present

## 2016-07-02 DIAGNOSIS — I10 Essential (primary) hypertension: Secondary | ICD-10-CM | POA: Diagnosis not present

## 2016-07-02 DIAGNOSIS — K5730 Diverticulosis of large intestine without perforation or abscess without bleeding: Secondary | ICD-10-CM | POA: Diagnosis not present

## 2016-07-02 LAB — URINALYSIS
BILIRUBIN URINE: NEGATIVE
Hgb urine dipstick: NEGATIVE
KETONES UR: NEGATIVE
LEUKOCYTES UA: NEGATIVE
Nitrite: NEGATIVE
PH: 7 (ref 5.0–8.0)
Specific Gravity, Urine: 1.01 (ref 1.000–1.030)
Total Protein, Urine: NEGATIVE
URINE GLUCOSE: NEGATIVE
UROBILINOGEN UA: 0.2 (ref 0.0–1.0)

## 2016-07-02 LAB — LIPID PANEL
CHOL/HDL RATIO: 3
CHOLESTEROL: 214 mg/dL — AB (ref 0–200)
HDL: 80.2 mg/dL (ref >39.00)
LDL Cholesterol: 120 mg/dL — ABNORMAL HIGH (ref 0–99)
NonHDL: 133.54
TRIGLYCERIDES: 69 mg/dL (ref 0.0–149.0)
VLDL: 13.8 mg/dL (ref 0.0–40.0)

## 2016-07-02 LAB — CBC WITH DIFFERENTIAL/PLATELET
BASOS PCT: 0.7 % (ref 0.0–3.0)
Basophils Absolute: 0.1 10*3/uL (ref 0.0–0.1)
EOS ABS: 0.1 10*3/uL (ref 0.0–0.7)
EOS PCT: 1.4 % (ref 0.0–5.0)
HEMATOCRIT: 46.7 % (ref 39.0–52.0)
HEMOGLOBIN: 16.5 g/dL (ref 13.0–17.0)
LYMPHS PCT: 22.6 % (ref 12.0–46.0)
Lymphs Abs: 1.8 10*3/uL (ref 0.7–4.0)
MCHC: 35.3 g/dL (ref 30.0–36.0)
MCV: 91 fl (ref 78.0–100.0)
MONOS PCT: 11.1 % (ref 3.0–12.0)
Monocytes Absolute: 0.9 10*3/uL (ref 0.1–1.0)
NEUTROS ABS: 5 10*3/uL (ref 1.4–7.7)
Neutrophils Relative %: 64.2 % (ref 43.0–77.0)
PLATELETS: 302 10*3/uL (ref 150.0–400.0)
RBC: 5.13 Mil/uL (ref 4.22–5.81)
RDW: 12.6 % (ref 11.5–15.5)
WBC: 7.8 10*3/uL (ref 4.0–10.5)

## 2016-07-02 LAB — BASIC METABOLIC PANEL
BUN: 13 mg/dL (ref 6–23)
CALCIUM: 9.4 mg/dL (ref 8.4–10.5)
CO2: 29 mEq/L (ref 19–32)
CREATININE: 1.14 mg/dL (ref 0.40–1.50)
Chloride: 100 mEq/L (ref 96–112)
GFR: 67.41 mL/min (ref >60.00)
Glucose, Bld: 103 mg/dL — ABNORMAL HIGH (ref 70–99)
Potassium: 4.2 mEq/L (ref 3.5–5.1)
SODIUM: 137 meq/L (ref 135–145)

## 2016-07-02 LAB — HEPATIC FUNCTION PANEL
ALT: 17 U/L (ref 0–53)
AST: 19 U/L (ref 0–37)
Albumin: 4.4 g/dL (ref 3.5–5.2)
Alkaline Phosphatase: 42 U/L (ref 39–117)
BILIRUBIN DIRECT: 0.2 mg/dL (ref 0.0–0.3)
BILIRUBIN TOTAL: 0.9 mg/dL (ref 0.2–1.2)
TOTAL PROTEIN: 7.2 g/dL (ref 6.0–8.3)

## 2016-07-02 LAB — TSH: TSH: 0.86 u[IU]/mL (ref 0.35–4.50)

## 2016-07-02 MED ORDER — METOPROLOL SUCCINATE ER 25 MG PO TB24: 25.0000 mg | ORAL_TABLET | Freq: Every day | ORAL | 3 refills | Status: DC

## 2016-07-02 MED ORDER — VALSARTAN-HYDROCHLOROTHIAZIDE 160-12.5 MG PO TABS: 1.0000 | ORAL_TABLET | Freq: Every day | ORAL | 3 refills | Status: DC

## 2016-07-02 NOTE — Progress Notes (Signed)
Spoke with pt and notified of results per Dr. Wert. Pt verbalized understanding and denied any questions. 

## 2016-07-02 NOTE — Patient Instructions (Signed)
Please remember to go to the lab and x-ray department downstairs for your tests - we will call you with the results when they are available.  cpx due in one year

## 2016-07-02 NOTE — Progress Notes (Signed)
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 He also has moderate obesity with a target weight of around 171 pounds.    History of Present Illness  Nov 22, 2008 change in bowel habits since changed diet to lots of greens assoc with migratory abd pain. no melena or hematochezia. colonoscopy nl 2003 except for diverticulosis. no wt loss. ? fever this am with no acute worsening in gi symptoms. rx citrcucel/ diet > helped alot with pain but still irreg bm's    January 13, 2009 cpx better except bm still irreg and saw R Arlyce DiceKaplan and plan colonoscopy 01/25/09> nl  02/24/2013 f/u ov/Manpreet Kemmer re multiple dx's/ annual comprehensive eval cc new abd pain migratory, mostly ruq, one month prior to OV  , resolved completely when stress resolved re fm member. Not  Much aerobics but no cp with exertion, no change bowel habits, no tia or claudication symptoms. rec rx as IBS  GI w/u with abd u/s 12/02/13 neg / Dr Arlyce DiceKaplan imp mscp from cough    05/26/2014 f/u ov/Wadie Mattie re: cpx  Chief Complaint  Patient presents with  . Annual Exam    Pt is fasting. He states doing well and denies any co's today.   Ex -= eliptical 4x weekly s cp or claudication/ tia / still working full time  RUQ pain better but not really compliant with citrucel or diet/ GI w/u neg  rec Prevnar today should be your last pneumonia shot  Avoid beans, undercooked vegetables and increase the citrucel to twice daily with large glass of water   . 07/03/2015  f/u ov/Fenton Candee re: cpx  Chief Complaint  Patient presents with  . Annual Exam    Pt is fasting. 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   ex daily  s cp/ claudication/ sob  rec No change rx    07/02/2016  f/u ov/Gracee Ratterree re: cpx /  Chief Complaint  Patient presents with  .  Annual Exam    Pt is fasting. He c/o right side pain off and on for the past wk- ? gas.   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 Diet related, exactly same pattern/ location as before ? Some better on pepcid     No  chest tightness, subjective wheeze overt sinus or hb symptoms. No unusual exp hx or h/o childhood pna/ asthma or knowledge of premature birth.   Sleeping ok without nocturnal  or early am exacerbation  of respiratory  c/o's or need for noct saba. Also denies any obvious fluctuation of symptoms with weather or environmental changes or other aggravating or alleviating factors except as outlined above   Current Medications, Allergies, Complete Past Medical History, Past Surgical History, Family History, and Social History were reviewed in [facility] electronic medical record.  ROS  The following are not active complaints unless bolded sore throat, dysphagia, dental problems, itching, sneezing,  nasal congestion or excess/ purulent secretions, ear ache,   fever, chills, sweats, unintended wt loss, pleuritic or exertional cp, hemoptysis,  orthopnea pnd or leg swelling, presyncope, palpitations, heartburn, abdominal pain recurrent   anorexia, nausea, vomiting, diarrhea  or change in bowel or urinary habits, change in stools or urine, dysuria,hematuria,  rash, arthralgias, visual complaints, headache, numbness  weakness or ataxia or problems with walking or coordination,  change in mood/affect or memory.           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)  CHEST PAIN,  ATYPICAL (ICD-786.59)  HYPERTENSION (ICD-401.9)  Fair complexion Pos Ak's.......................................................................Marland Kitchen.[name]  HEALTH MAINTENANCE.........................................................................Marland Kitchen.Emry Tobin  - Tdap  02/24/2013  - Pneumovax 01/2012,  05/26/2014  Prevar - CPX 07/02/2016    Family History:  lung cancer in father  ovarian cancer in mother  no siblings  No FH of Colon Cancer   Social History:  denies ever smoking   Alcohol Use - yes: one glass of wine daily  Illicit Drug Use - no  Daily Caffeine Use: one cup of coffee daily             Objective:   Physical Exam  ambulatory healthy appearing white male in no distress.   Vital signs reviewed  wt 194 August 09, 2008 > 179 June 29, 2009 >  02/05/2011 175> 02/19/2012  176> 11/06/2012  177 > 02/24/2013  174> 12/01/2013 178  > 05/26/2014 178 > 07/03/2015   183 > 07/02/2016  181   HEENT: nl dentition, turbinates, and orophanx. Nl external ear canals without cough reflex  NECK : without JVD/Nodes/TM/ nl carotid upstrokes bilaterally  LUNGS: no acc muscle use, clear to A and P bilaterally without cough on insp or exp maneuvers  CV: RRR no s3 or murmur or increase in P2, no edema  ABD: soft and nontender with nl excursion in the supine position. No bruits or organomegaly, bowel sounds nl  MS: warm without deformities, calf tenderness, cyanosis or clubbing  Skin fair complexion/ a few sk's over back  GU testes down, no nodules or IH Rectal mild bph, smooth texture, no nodules, stool g neg           CXR PA and Lateral:   07/02/2016 :    I personally reviewed images and agree with radiology impression as follows:    No active cardiopulmonary disease.  Labs ordered/ reviewed:    Chemistry      Component Value Date/Time   NA 137 07/02/2016 1043   K 4.2 07/02/2016 1043   CL 100 07/02/2016 1043   CO2 29 07/02/2016 1043   BUN 13 07/02/2016 1043   CREATININE 1.14 07/02/2016  1043      Component Value Date/Time   CALCIUM 9.4 07/02/2016 1043   ALKPHOS 42 07/02/2016 1043   AST 19 07/02/2016 1043   ALT 17 07/02/2016 1043   BILITOT 0.9 07/02/2016 1043        Lab Results  Component Value Date   WBC 7.8 07/02/2016   HGB 16.5 07/02/2016   HCT 46.7 07/02/2016   MCV 91.0 07/02/2016   PLT 302.0 07/02/2016       Lab Results  Component Value Date   TSH 0.86 07/02/2016          EKG  07/02/2016 nsr wnl         Assessment:

## 2016-07-07 NOTE — Assessment & Plan Note (Signed)
Adequate control on present rx, reviewed in detail with pt > no change in rx needed   

## 2016-07-07 NOTE — Assessment & Plan Note (Addendum)
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

## 2016-07-07 NOTE — Assessment & Plan Note (Signed)
F/u [facility] -Diverticulosis by colonoscopy July 2003, repeat January 25, 2009 no change   

## 2016-07-07 NOTE — Assessment & Plan Note (Signed)
-   Target LDL < 130 due to hbp/ male gender/ type A   . Lab Results  Component Value Date   CHOL 214 (H) 07/02/2016   HDL 80.20 07/02/2016   LDLCALC 120 (H) 07/02/2016   LDLDIRECT 125.0 01/26/2010   TRIG 69.0 07/02/2016   CHOLHDL 3 07/02/2016   Adequate control on present rx, reviewed in detail with pt > no change in rx needed  = diet /ex

## 2017-07-03 ENCOUNTER — Ambulatory Visit (INDEPENDENT_AMBULATORY_CARE_PROVIDER_SITE_OTHER)
Admission: RE | Admit: 2017-07-03 | Discharge: 2017-07-03 | Disposition: A | Discharge status: Home / Self Care | Payer: Medicare Other | Source: Ambulatory Visit | Attending: Internal Medicine | Admitting: Internal Medicine

## 2017-07-03 ENCOUNTER — Encounter: Payer: Medicare Other | Admitting: Internal Medicine

## 2017-07-03 ENCOUNTER — Other Ambulatory Visit (INDEPENDENT_AMBULATORY_CARE_PROVIDER_SITE_OTHER): Payer: Medicare Other

## 2017-07-03 ENCOUNTER — Encounter: Payer: Self-pay | Admitting: Internal Medicine

## 2017-07-03 ENCOUNTER — Ambulatory Visit (INDEPENDENT_AMBULATORY_CARE_PROVIDER_SITE_OTHER): Payer: Medicare Other | Admitting: Internal Medicine

## 2017-07-03 VITALS — BP 162/98 | HR 80 | Ht 67.0 in | Wt 185.0 lb

## 2017-07-03 DIAGNOSIS — E785 Hyperlipidemia, unspecified: Secondary | ICD-10-CM

## 2017-07-03 DIAGNOSIS — I10 Essential (primary) hypertension: Secondary | ICD-10-CM | POA: Diagnosis not present

## 2017-07-03 DIAGNOSIS — K5730 Diverticulosis of large intestine without perforation or abscess without bleeding: Secondary | ICD-10-CM

## 2017-07-03 DIAGNOSIS — K649 Unspecified hemorrhoids: Secondary | ICD-10-CM | POA: Diagnosis not present

## 2017-07-03 LAB — URINALYSIS
BILIRUBIN URINE: NEGATIVE
Ketones, ur: NEGATIVE
LEUKOCYTES UA: NEGATIVE
NITRITE: NEGATIVE
Specific Gravity, Urine: 1.01 (ref 1.000–1.030)
Total Protein, Urine: NEGATIVE
UROBILINOGEN UA: 0.2 (ref 0.0–1.0)
Urine Glucose: NEGATIVE
pH: 7 (ref 5.0–8.0)

## 2017-07-03 LAB — CBC WITH DIFFERENTIAL/PLATELET
BASOS ABS: 0.1 10*3/uL (ref 0.0–0.1)
Basophils Relative: 1.1 % (ref 0.0–3.0)
Eosinophils Absolute: 0.1 10*3/uL (ref 0.0–0.7)
Eosinophils Relative: 1 % (ref 0.0–5.0)
HEMATOCRIT: 48.7 % (ref 39.0–52.0)
HEMOGLOBIN: 16.7 g/dL (ref 13.0–17.0)
LYMPHS ABS: 1.5 10*3/uL (ref 0.7–4.0)
LYMPHS PCT: 18.8 % (ref 12.0–46.0)
MCHC: 34.4 g/dL (ref 30.0–36.0)
MCV: 92.3 fl (ref 78.0–100.0)
MONOS PCT: 10.9 % (ref 3.0–12.0)
Monocytes Absolute: 0.9 10*3/uL (ref 0.1–1.0)
NEUTROS PCT: 68.2 % (ref 43.0–77.0)
Neutro Abs: 5.5 10*3/uL (ref 1.4–7.7)
Platelets: 274 10*3/uL (ref 150.0–400.0)
RBC: 5.27 Mil/uL (ref 4.22–5.81)
RDW: 13 % (ref 11.5–15.5)
WBC: 8 10*3/uL (ref 4.0–10.5)

## 2017-07-03 LAB — HEPATIC FUNCTION PANEL
ALT: 12 U/L (ref 0–53)
AST: 15 U/L (ref 0–37)
Albumin: 4.2 g/dL (ref 3.5–5.2)
Alkaline Phosphatase: 45 U/L (ref 39–117)
BILIRUBIN TOTAL: 1.2 mg/dL (ref 0.2–1.2)
Bilirubin, Direct: 0.2 mg/dL (ref 0.0–0.3)
Total Protein: 6.9 g/dL (ref 6.0–8.3)

## 2017-07-03 LAB — LIPID PANEL
Cholesterol: 186 mg/dL (ref 0–200)
HDL: 67.7 mg/dL (ref >39.00)
LDL Cholesterol: 103 mg/dL — ABNORMAL HIGH (ref 0–99)
NonHDL: 117.96
TRIGLYCERIDES: 77 mg/dL (ref 0.0–149.0)
Total CHOL/HDL Ratio: 3
VLDL: 15.4 mg/dL (ref 0.0–40.0)

## 2017-07-03 LAB — BASIC METABOLIC PANEL
BUN: 14 mg/dL (ref 6–23)
CHLORIDE: 99 meq/L (ref 96–112)
CO2: 31 meq/L (ref 19–32)
CREATININE: 1.21 mg/dL (ref 0.40–1.50)
Calcium: 8.9 mg/dL (ref 8.4–10.5)
GFR: 62.75 mL/min (ref >60.00)
Glucose, Bld: 103 mg/dL — ABNORMAL HIGH (ref 70–99)
Potassium: 4.1 mEq/L (ref 3.5–5.1)
Sodium: 136 mEq/L (ref 135–145)

## 2017-07-03 LAB — TSH: TSH: 0.88 u[IU]/mL (ref 0.35–4.50)

## 2017-07-03 MED ORDER — METOPROLOL SUCCINATE ER 50 MG PO TB24: 50.0000 mg | ORAL_TABLET | Freq: Every day | ORAL | 11 refills | Status: DC

## 2017-07-03 MED ORDER — VALSARTAN-HYDROCHLOROTHIAZIDE 160-12.5 MG PO TABS: 1.0000 | ORAL_TABLET | Freq: Every day | ORAL | 3 refills | Status: DC

## 2017-07-03 MED ORDER — HYDROCORTISONE 2.5 % RE CREA: 1.0000 "application " | TOPICAL_CREAM | Freq: Two times a day (BID) | RECTAL | 2 refills | Status: DC

## 2017-07-03 NOTE — Progress Notes (Signed)
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 He also has moderate obesity with a target weight of around 171 pounds.    History of Present Illness  Nov 22, 2008 change in bowel habits since changed diet to lots of greens assoc with migratory abd pain. no melena or hematochezia. colonoscopy nl 2003 except for diverticulosis. no wt loss. ? fever this am with no acute worsening in gi symptoms. rx citrcucel/ diet > helped alot with pain but still irreg bm's    January 13, 2009 cpx better except bm still irreg and saw R Arlyce DiceKaplan and plan colonoscopy 01/25/09> nl  02/24/2013 f/u ov/Lainey Nelson re multiple dx's/ annual comprehensive eval cc new abd pain migratory, mostly ruq, one month prior to OV  , resolved completely when stress resolved re fm member. Not  Much aerobics but no cp with exertion, no change bowel habits, no tia or claudication symptoms. rec rx as IBS  GI w/u with abd u/s 12/02/13 neg / Dr Arlyce DiceKaplan imp mscp from cough        07/03/2017  f/u ov/Viann Nielson re: comprehensive eval for hbp Chief Complaint  Patient presents with  . Annual Exam  new c/o hem x sev months/ no bleeding  Works out tiw s cp/ no claudication / no sob/no tia  No obvious day to day or daytime variability or assoc excess/ purulent sputum or mucus plugs or hemoptysis or cp or chest tightness, subjective wheeze or overt sinus or hb symptoms. No unusual exposure hx or h/o childhood pna/ asthma or knowledge of premature birth.  Sleeping ok flat without nocturnal  or early am exacerbation  of respiratory  c/o's or need for noct saba. Also denies any obvious fluctuation of symptoms with weather or environmental changes or other aggravating or alleviating factors except as outlined above   Current Allergies, Complete Past Medical  History, Past Surgical History, Family History, and Social History were reviewed in [facility] electronic medical record.  ROS  The following are not active complaints unless bolded Hoarseness, sore throat, dysphagia, dental problems, itching, sneezing,  nasal congestion or discharge of excess mucus or purulent secretions, ear ache,   fever, chills, sweats, unintended wt loss or wt gain, classically pleuritic or exertional cp,  orthopnea pnd or leg swelling, presyncope, palpitations, abdominal pain, anorexia, nausea, vomiting, diarrhea  or change in bowel habits or change in bladder habits, change in stools or change in urine, dysuria, hematuria,  rash, arthralgias, visual complaints, headache, numbness, weakness or ataxia or problems with walking or coordination,  change in mood/affect or memory.        Current Meds  Medication Sig  . aspirin 325 MG tablet Take 325 mg by mouth daily.    . calcium carbonate (TUMS - DOSED IN MG ELEMENTAL CALCIUM) 500 MG chewable tablet Chew 1 tablet by mouth daily as needed.    . famotidine (PEPCID) 20 MG tablet Take 20 mg by mouth as needed.   Marland Kitchen. ibuprofen (ADVIL) 200 MG tablet Take 200 mg by mouth every 6 (six) hours as needed.    . methylcellulose (CITRUCEL) oral powder Take 1 packet by mouth daily.    . metroNIDAZOLE (METROCREAM) 0.75 % cream Apply 1 application topically 2 (two) times daily.  . Omega-3 Fatty Acids (  FISH OIL) 1000 MG CAPS Take 1 capsule by mouth 3 (three) times daily.   [name] Polyethyl Glycol-Propyl Glycol (SYSTANE OP) Apply 3-4 drops to eye daily.  . valsartan-hydrochlorothiazide (DIOVAN-HCT) 160-12.5 MG tablet Take 1 tablet by mouth daily.  . [ metoprolol succinate (TOPROL-XL) 25 MG 24 hr tablet Take 1 tablet (25 mg total) by mouth daily.  . [  valsartan-hydrochlorothiazide (DIOVAN-HCT) 160-12.5 MG tablet Take 1 tablet by mouth daily.                   Past Medical History:  OVERWEIGHT (ICD-278.02)  - Target wt = 185 for BMI < 30,  ideal 166 for BMI < 26  DIVERTICULOSIS OF COLON (ICD-562.10)...................................Marland Kitchen.   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)  CHEST PAIN, ATYPICAL (ICD-786.59)  HYPERTENSION (ICD-401.9)  Fair complexion Pos Ak's.......................................................................Marland Kitchen.[name]  HEALTH MAINTENANCE.........................................................................Marland Kitchen.Ludia Gartland  - Tdap  02/24/2013  - Pneumovax 01/2012,  05/26/2014  Prevar - CPX 07/03/2017     Family History:  lung cancer in father  ovarian cancer in mother  no siblings  No FH of Colon Cancer   Social History:  denies ever smoking   Alcohol Use - yes: one glass of wine daily  Illicit Drug Use - no  Daily Caffeine Use: one cup of coffee daily             Objective:   Physical Exam  Pleasant amb wm nad   Vital signs reviewed - Note on arrival 02 sats  98% on RA     wt 194 August 09, 2008 > 179 June 29, 2009 >  02/05/2011 175> 02/19/2012  176> 11/06/2012  177 > 02/24/2013  174> 12/01/2013 178  > 05/26/2014 178 > 07/03/2015   183 > 07/02/2016  181 > 185    HEENT: nl dentition, turbinates bilaterally, and oropharynx. Nl external ear canals without cough reflex   NECK :  without JVD/Nodes/TM/ nl carotid upstrokes bilaterally   LUNGS: no acc muscle use,  Nl contour chest which is clear to A and P bilaterally without cough on insp or exp maneuvers   CV:  RRR  no s3 or murmur or increase in P2, and no edema   ABD:  soft and nontender with nl inspiratory excursion in the supine position. No bruits or organomegaly appreciated, bowel sounds nl  MS:  Nl gait/ ext warm without deformities, calf tenderness, cyanosis or clubbing No obvious joint restrictions   SKIN:  warm and dry without lesions    NEURO:  alert, approp, nl sensorium with  no motor or cerebellar deficits apparent.   GU :  Testes down bilaterally/ no IH/ no nodules  Rectal:  Firm ext hem at 6 oclock / mild bph/ stool g neg       CXR PA and Lateral:   07/03/2017 :    I personally reviewed images and agree with radiology impression as follows:   No acute abnormality noted.   Labs ordered/ reviewed:      Chemistry      Component Value Date/Time   NA 136 07/03/2017 1046   K 4.1 07/03/2017 1046   CL 99 07/03/2017 1046   CO2 31 07/03/2017 1046   BUN 14 07/03/2017 1046   CREATININE 1.21 07/03/2017 1046      Component Value Date/Time  CALCIUM 8.9 07/03/2017 1046   ALKPHOS 45 07/03/2017 1046   AST 15 07/03/2017 1046   ALT 12 07/03/2017 1046   BILITOT 1.2 07/03/2017 1046        Lab Results  Component Value Date   WBC 8.0 07/03/2017   HGB 16.7 07/03/2017   HCT 48.7 07/03/2017   MCV 92.3 07/03/2017   PLT 274.0 07/03/2017          Lab Results  Component Value Date   TSH 0.88 07/03/2017                   Assessment:

## 2017-07-03 NOTE — Patient Instructions (Signed)
Anusol HC up to twice daily and soaks in tub (Epson salts) work well   Call after the holidays for referral if needed   Increase toprol to 50 mg one daily   Please remember to go to the lab and x-ray department downstairs in the basement  for your tests - we will call you with the results when they are available.     Please schedule a follow up visit in 12 months but call sooner if needed

## 2017-07-06 ENCOUNTER — Encounter: Payer: Self-pay | Admitting: Internal Medicine

## 2017-07-06 DIAGNOSIS — K649 Unspecified hemorrhoids: Secondary | ICD-10-CM | POA: Insufficient documentation

## 2017-07-06 NOTE — Assessment & Plan Note (Signed)
Not Adequate control on present rx, reviewed in detail with pt > try toprol 50 mg daily and self monitor/ avoid salt and nsaids

## 2017-07-06 NOTE — Assessment & Plan Note (Signed)
Lab Results  Component Value Date   CHOL 186 07/03/2017   HDL 67.70 07/03/2017   LDLCALC 103 (H) 07/03/2017   LDLDIRECT 125.0 01/26/2010   TRIG 77.0 07/03/2017   CHOLHDL 3 07/03/2017    Adequate control on present rx, reviewed in detail with pt > no change in rx needed  = diet/ ex only

## 2017-07-06 NOTE — Assessment & Plan Note (Signed)
F/u  GI -Diverticulosis by colonoscopy July 2003, repeat January 25, 2009 no change > rec f/u 2010

## 2017-07-06 NOTE — Assessment & Plan Note (Signed)
rx anusol HC 07/03/2017 > f/u GI prn

## 2017-07-19 ENCOUNTER — Other Ambulatory Visit: Payer: Self-pay | Admitting: Internal Medicine

## 2018-06-26 ENCOUNTER — Telehealth: Payer: Self-pay | Admitting: Internal Medicine

## 2018-06-26 MED ORDER — METOPROLOL SUCCINATE ER 50 MG PO TB24: 50.0000 mg | ORAL_TABLET | Freq: Every day | ORAL | 0 refills | Status: DC

## 2018-06-26 NOTE — Telephone Encounter (Signed)
Called and spoke with Patient. He only has enough Metoprolol to last till 07/01/18.  Patient is scheduled to see Dr. Sherene SiresWert 07/07/18 at 1000. Prescription with no refills sent to preferred pharmacy.  Patient reminded of OV.  Understanding stated.  Nothing further at this time.

## 2018-07-07 ENCOUNTER — Ambulatory Visit (INDEPENDENT_AMBULATORY_CARE_PROVIDER_SITE_OTHER): Payer: Medicare Other | Admitting: Internal Medicine

## 2018-07-07 ENCOUNTER — Encounter: Payer: Self-pay | Admitting: Internal Medicine

## 2018-07-07 VITALS — BP 162/90 | HR 74 | Ht 67.0 in | Wt 186.0 lb

## 2018-07-07 DIAGNOSIS — E785 Hyperlipidemia, unspecified: Secondary | ICD-10-CM

## 2018-07-07 DIAGNOSIS — K640 First degree hemorrhoids: Secondary | ICD-10-CM

## 2018-07-07 DIAGNOSIS — I10 Essential (primary) hypertension: Secondary | ICD-10-CM

## 2018-07-07 DIAGNOSIS — N400 Enlarged prostate without lower urinary tract symptoms: Secondary | ICD-10-CM | POA: Diagnosis not present

## 2018-07-07 DIAGNOSIS — K589 Irritable bowel syndrome without diarrhea: Secondary | ICD-10-CM

## 2018-07-07 LAB — HEPATIC FUNCTION PANEL
ALBUMIN: 4.4 g/dL (ref 3.5–5.2)
ALT: 17 U/L (ref 0–53)
AST: 18 U/L (ref 0–37)
Alkaline Phosphatase: 50 U/L (ref 39–117)
Bilirubin, Direct: 0.1 mg/dL (ref 0.0–0.3)
TOTAL PROTEIN: 7 g/dL (ref 6.0–8.3)
Total Bilirubin: 1 mg/dL (ref 0.2–1.2)

## 2018-07-07 LAB — CBC WITH DIFFERENTIAL/PLATELET
BASOS ABS: 0.1 10*3/uL (ref 0.0–0.1)
Basophils Relative: 0.7 % (ref 0.0–3.0)
Eosinophils Absolute: 0.1 10*3/uL (ref 0.0–0.7)
Eosinophils Relative: 1.1 % (ref 0.0–5.0)
HCT: 47.3 % (ref 39.0–52.0)
Hemoglobin: 16.6 g/dL (ref 13.0–17.0)
Lymphocytes Relative: 21.1 % (ref 12.0–46.0)
Lymphs Abs: 1.8 10*3/uL (ref 0.7–4.0)
MCHC: 35.1 g/dL (ref 30.0–36.0)
MCV: 90.6 fl (ref 78.0–100.0)
Monocytes Absolute: 0.8 10*3/uL (ref 0.1–1.0)
Monocytes Relative: 9.4 % (ref 3.0–12.0)
Neutro Abs: 5.6 10*3/uL (ref 1.4–7.7)
Neutrophils Relative %: 67.7 % (ref 43.0–77.0)
Platelets: 274 10*3/uL (ref 150.0–400.0)
RBC: 5.22 Mil/uL (ref 4.22–5.81)
RDW: 12.6 % (ref 11.5–15.5)
WBC: 8.3 10*3/uL (ref 4.0–10.5)

## 2018-07-07 LAB — BASIC METABOLIC PANEL
BUN: 14 mg/dL (ref 6–23)
CO2: 28 meq/L (ref 19–32)
Calcium: 9.5 mg/dL (ref 8.4–10.5)
Chloride: 99 mEq/L (ref 96–112)
Creatinine, Ser: 1.23 mg/dL (ref 0.40–1.50)
GFR: 61.4 mL/min (ref >60.00)
Glucose, Bld: 110 mg/dL — ABNORMAL HIGH (ref 70–99)
Potassium: 3.8 mEq/L (ref 3.5–5.1)
Sodium: 136 mEq/L (ref 135–145)

## 2018-07-07 LAB — LIPID PANEL
Cholesterol: 198 mg/dL (ref 0–200)
HDL: 67 mg/dL (ref >39.00)
LDL Cholesterol: 110 mg/dL — ABNORMAL HIGH (ref 0–99)
NonHDL: 130.89
TRIGLYCERIDES: 102 mg/dL (ref 0.0–149.0)
Total CHOL/HDL Ratio: 3
VLDL: 20.4 mg/dL (ref 0.0–40.0)

## 2018-07-07 LAB — TSH: TSH: 0.95 u[IU]/mL (ref 0.35–4.50)

## 2018-07-07 MED ORDER — METOPROLOL SUCCINATE ER 50 MG PO TB24: ORAL_TABLET | ORAL | 3 refills | Status: DC

## 2018-07-07 MED ORDER — AMLODIPINE-VALSARTAN-HCTZ 5-160-12.5 MG PO TABS: ORAL_TABLET | ORAL | 3 refills | Status: DC

## 2018-07-07 NOTE — Progress Notes (Signed)
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 He also has moderate obesity with a target weight of around 171 pounds.    History of Present Illness    07/03/2017  f/u ov/Wert re: comprehensive eval for hbp Chief Complaint  Patient presents with  . Annual Exam  new c/o hem x sev months/ no bleeding  Works out tiw s cp/ no claudication / no sob/no tia rec Anusol HC up to twice daily and soaks in tub (Epson salts) work well  Call after the holidays for referral if needed  Increase toprol to 50 mg one daily  Please remember to go to the lab and x-ray department downstairs in the basement  for your tests - we will call you with the results when they are available.  Please schedule a follow up visit in 12 months but call sooner if needed     07/07/2018  f/u ov/Wert re: hbp/ ibs  Chief Complaint  Patient presents with  . Annual Exam    Pt is fasting. He states overall doing well today and no new co's.  Dyspnea:  Aerobics no problem with sob/ cp/ claudication  Cough:  no Sleeping: ok   migrating abd pain x years some better with citrucel/ diet   No obvious day to day or daytime variability or assoc excess/ purulent sputum or mucus plugs or hemoptysis or cp or chest tightness, subjective wheeze or overt sinus or hb symptoms.   Sleeping  without nocturnal  or early am exacerbation  of respiratory  c/o's or need for noct saba. Also denies any obvious fluctuation of symptoms with weather or environmental changes or other aggravating or alleviating factors except as outlined above   No unusual exposure hx or h/o childhood pna/ asthma or knowledge of premature birth.  Current Allergies, Complete Past Medical History, Past Surgical History, Family History, and Social History were  reviewed in [facility] record.  ROS  The following are not active complaints unless bolded Hoarseness, sore throat, dysphagia, dental problems, itching, sneezing,  nasal congestion or discharge of excess mucus or purulent secretions, ear ache,   fever, chills, sweats, unintended wt loss or wt gain, classically pleuritic or exertional cp,  orthopnea pnd or arm/hand swelling  or leg swelling, presyncope, palpitations, abdominal pain, anorexia, nausea, vomiting, diarrhea  or change in bowel habits or change in bladder habits, change in stools or change in urine, dysuria, hematuria,  rash, arthralgias, visual complaints, headache, numbness, weakness or ataxia or problems with walking or coordination,  change in mood or  memory.        Current Meds  Medication Sig  . aspirin 325 MG tablet Take 325 mg by mouth daily.    . calcium carbonate (TUMS - DOSED IN MG ELEMENTAL CALCIUM) 500 MG chewable tablet Chew 1 tablet by mouth daily as needed.    . famotidine (PEPCID) 20 MG tablet Take 20 mg by mouth as needed.   . hydrocortisone (ANUSOL-HC) 2.5 % rectal cream Place 1 application rectally 2 (two) times daily.  Marland Kitchen ibuprofen (ADVIL) 200 MG tablet Take 200 mg by mouth every 6 (six) hours as needed.    . methylcellulose (CITRUCEL) oral powder Take 1 packet by mouth daily.    Marland Kitchen  metoprolol succinate (TOPROL XL) 50 MG 24 hr tablet Take with or immediately following a meal.  . metroNIDAZOLE (METROCREAM) 0.75 % cream Apply 1 application topically 2 (two) times daily.  . Omega-3 Fatty Acids (FISH OIL) 1000 MG CAPS Take 1 capsule by mouth 3 (three) times daily.   Bertram Gala Glycol-Propyl Glycol (SYSTANE OP) Apply 3-4 drops to eye daily.  .     . [  valsartan-hydrochlorothiazide (DIOVAN-HCT) 160-12.5 MG tablet Take 1 tablet by mouth daily.                      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)  CHEST PAIN, ATYPICAL (ICD-786.59)  HYPERTENSION (ICD-401.9)  Fair complexion Pos Ak's.......................................................................Marland KitchenTurner  HEALTH MAINTENANCE.........................................................................Marland KitchenWert  - Tdap  02/24/2013  - Pneumovax 01/2012,  05/26/2014  Prevar - CPX 07/07/2018   Family History:  lung cancer in father  ovarian cancer in mother  no siblings  No FH of Colon Cancer   Social History:  denies ever smoking  Alcohol Use - yes: one glass of wine daily  Illicit Drug Use - no  Daily Caffeine Use: one cup of coffee daily             Objective:   Physical Exam  amb wm nad  Vital signs reviewed - Note on arrival 02 sats  97% on RA     wt 194 August 09, 2008 > 179 June 29, 2009 >  02/05/2011 175> 02/19/2012  176> 11/06/2012  177 > 02/24/2013  174> 12/01/2013 178  > 05/26/2014 178 > 07/03/2015   183 > 07/02/2016  181 >  07/07/2018  186       HEENT: nl dentition, turbinates bilaterally, and oropharynx. Nl external ear canals without cough reflex   NECK :  without JVD/Nodes/TM/ nl carotid upstrokes bilaterally   LUNGS: no acc muscle use,  Nl contour chest which is clear to A and P bilaterally without cough on insp or exp maneuvers   CV:  RRR  no s3 or murmur or increase in P2, and no edema   ABD:  soft and nontender with nl inspiratory excursion in the supine position. No bruits or organomegaly appreciated, bowel sounds nl  MS:  Nl gait/ ext warm without deformities, calf tenderness, cyanosis or clubbing No obvious joint restrictions   SKIN: warm and dry without lesions    NEURO:   alert, approp, nl sensorium with  no motor or cerebellar deficits apparent.       GU: Testes descended bilaterally with no evidence of inguinal hernia or testicular nodules.  Rectal reveals mild BPH / no nodules   guaiac is negative no visible visible or palpable hemorrhoids.   ekg 07/07/2018  Nsr/ wnl    Labs ordered/ reviewed:      Chemistry      Component Value Date/Time   NA 136 07/07/2018 1036   K 3.8 07/07/2018 1036   CL 99 07/07/2018 1036   CO2 28 07/07/2018 1036   BUN 14 07/07/2018 1036   CREATININE 1.23 07/07/2018 1036      Component Value Date/Time   CALCIUM 9.5  07/07/2018 1036   ALKPHOS 50 07/07/2018 1036   AST 18 07/07/2018 1036   ALT 17 07/07/2018 1036   BILITOT 1.0 07/07/2018 1036        Lab Results  Component Value Date   WBC 8.3 07/07/2018   HGB 16.6 07/07/2018   HCT 47.3 07/07/2018   MCV 90.6 07/07/2018   PLT 274.0 07/07/2018        Lab Results  Component Value Date   TSH 0.95 07/07/2018       Lab Results  Component Value Date   CHOL 198 07/07/2018   HDL 67.00 07/07/2018   LDLCALC 110 (H) 07/07/2018   LDLDIRECT 125.0 01/26/2010   TRIG 102.0 07/07/2018   CHOLHDL 3 07/07/2018      Assessment:

## 2018-07-07 NOTE — Progress Notes (Signed)
LMTCB

## 2018-07-07 NOTE — Patient Instructions (Signed)
Please remember to go to the lab department   for your tests - we will call you with the results when they are available.      Please schedule a follow up visit in 12  months but call sooner if needed  

## 2018-07-08 ENCOUNTER — Encounter: Payer: Self-pay | Admitting: Internal Medicine

## 2018-07-08 NOTE — Assessment & Plan Note (Signed)
Lab Results  Component Value Date   CREATININE 1.23 07/07/2018   CREATININE 1.21 07/03/2017   CREATININE 1.14 07/02/2016     Not optimally controlled on present regimen. I reviewed this with the patient and  rec add amlodipine 5 mg back to rx as combination with arb/hct and continue to self monitor blood pressures.

## 2018-07-08 NOTE — Assessment & Plan Note (Signed)
-   abd u/s neg 12/02/2013 x lots of gas > better with citrucel  - Referred to GI 12/01/13 > no w/u warranted   This problem is chronic and unchanged from baseline.  No additional work-up is needed at this point.  I did advise if there is a change in a pattern that he needs to contact the GI department right away.   I had an extended discussion with the patient reviewing all relevant studies completed to date and  lasting 15 to 20 minutes of a 25 minute comprehensive yearly office visit    Each maintenance medication was reviewed in detail including most importantly the difference between maintenance and prns and under what circumstances the prns are to be triggered using an action plan format that is not reflected in the computer generated alphabetically organized AVS.     Please see AVS for specific instructions unique to this visit that I personally wrote and verbalized to the the pt in detail and then reviewed with pt  by my nurse highlighting any  changes in therapy recommended at today's visit to their plan of care.

## 2018-07-08 NOTE — Assessment & Plan Note (Addendum)
-   Target LDL < 130 due to hbp/ male gender/ type A   Adequate control on present rx, reviewed in detail with pt > no change in rx needed

## 2018-07-08 NOTE — Assessment & Plan Note (Signed)
Exam is normal and he has no risk factors for prostate cancer and at age 72 he is at a higher  risk benefit fromPSA screening and declined it again today.

## 2018-07-08 NOTE — Assessment & Plan Note (Signed)
None palpable on today's exam and hemoglobin is nl

## 2018-07-09 ENCOUNTER — Telehealth: Payer: Self-pay | Admitting: Internal Medicine

## 2018-07-09 NOTE — Telephone Encounter (Signed)
Call made to patient, states his Amlodipine-Valsartan is not covered by his insurance and it will cost $415. Pharmacy suggests sending Amlodipine and Valsartan separately in individual prescriptions because they are covered individually. I did advise patient we could call insurance and see if tier exception or PA was required. Patient requests that we speak with MW first.   MW please advise.

## 2018-07-10 MED ORDER — AMLODIPINE BESYLATE 5 MG PO TABS: 5.0000 mg | ORAL_TABLET | Freq: Every day | ORAL | 3 refills | Status: DC

## 2018-07-10 MED ORDER — VALSARTAN-HYDROCHLOROTHIAZIDE 160-12.5 MG PO TABS: 1.0000 | ORAL_TABLET | Freq: Every day | ORAL | 3 refills | Status: DC

## 2018-07-10 NOTE — Telephone Encounter (Signed)
Patient returned call; pt contact # [phone]

## 2018-07-10 NOTE — Telephone Encounter (Signed)
Fine to separate them and add amlodipine 5 mg daily  to the valsartan hctz combo he already takes   It may be able to save money on a similar triple combo but would need to provide me with a list of options from his drug formulary which I don't have access to

## 2018-07-10 NOTE — Telephone Encounter (Signed)
"  valsartan hctz combo he already takes" should be clear as I can make it

## 2018-07-10 NOTE — Telephone Encounter (Signed)
Called and spoke with Patient.  Per Dr. Sherene SiresWert - valsartan hctz that he already takes,  106-12.5mg  tabs, and Amlodipine 5mg .  Patient stated understanding.  [facility] [place].  Nothing further.

## 2018-07-10 NOTE — Telephone Encounter (Signed)
Called and spoke with patient, he is aware and verbalized understanding. MW please advise we can send in Amlodipine 5 mg tablets but what strength do we send in for the Valsartan on its own? Patients insurance will not cover the combination of amlodipine-valsartan. Thank you.

## 2018-07-10 NOTE — Telephone Encounter (Signed)
LMTCB.   Per MW:  Fine to separate them and add amlodipine 5 mg daily  to the valsartan hctz combo he already takes   It may be able to save money on a similar triple combo but would need to provide me with a list of options from his drug formulary which I don't have access to.   See previous message as well.

## 2019-07-05 ENCOUNTER — Other Ambulatory Visit: Payer: Self-pay | Admitting: Internal Medicine

## 2019-07-06 ENCOUNTER — Other Ambulatory Visit: Payer: Self-pay | Admitting: Internal Medicine

## 2019-07-08 ENCOUNTER — Other Ambulatory Visit: Payer: Self-pay

## 2019-07-08 ENCOUNTER — Ambulatory Visit (INDEPENDENT_AMBULATORY_CARE_PROVIDER_SITE_OTHER): Payer: Medicare Other

## 2019-07-08 ENCOUNTER — Ambulatory Visit (INDEPENDENT_AMBULATORY_CARE_PROVIDER_SITE_OTHER): Payer: Medicare Other | Admitting: Internal Medicine

## 2019-07-08 ENCOUNTER — Telehealth: Payer: Self-pay | Admitting: Internal Medicine

## 2019-07-08 ENCOUNTER — Encounter: Payer: Self-pay | Admitting: Internal Medicine

## 2019-07-08 VITALS — BP 170/88 | HR 70 | Temp 97.5°F | Ht 67.0 in | Wt 186.0 lb

## 2019-07-08 DIAGNOSIS — H9190 Unspecified hearing loss, unspecified ear: Secondary | ICD-10-CM | POA: Diagnosis not present

## 2019-07-08 DIAGNOSIS — K588 Other irritable bowel syndrome: Secondary | ICD-10-CM

## 2019-07-08 DIAGNOSIS — H9193 Unspecified hearing loss, bilateral: Secondary | ICD-10-CM

## 2019-07-08 DIAGNOSIS — I10 Essential (primary) hypertension: Secondary | ICD-10-CM | POA: Diagnosis not present

## 2019-07-08 DIAGNOSIS — E785 Hyperlipidemia, unspecified: Secondary | ICD-10-CM

## 2019-07-08 DIAGNOSIS — N400 Enlarged prostate without lower urinary tract symptoms: Secondary | ICD-10-CM

## 2019-07-08 LAB — URINALYSIS
Bilirubin Urine: NEGATIVE
Ketones, ur: NEGATIVE
Leukocytes,Ua: NEGATIVE
Nitrite: NEGATIVE
Specific Gravity, Urine: 1.02 (ref 1.000–1.030)
Total Protein, Urine: NEGATIVE
Urine Glucose: NEGATIVE
Urobilinogen, UA: 0.2 (ref 0.0–1.0)
pH: 7 (ref 5.0–8.0)

## 2019-07-08 LAB — CBC WITH DIFFERENTIAL/PLATELET
Basophils Absolute: 0.1 10*3/uL (ref 0.0–0.1)
Basophils Relative: 1.1 % (ref 0.0–3.0)
Eosinophils Absolute: 0.1 10*3/uL (ref 0.0–0.7)
Eosinophils Relative: 1.1 % (ref 0.0–5.0)
HCT: 50 % (ref 39.0–52.0)
Hemoglobin: 16.7 g/dL (ref 13.0–17.0)
Lymphocytes Relative: 20.7 % (ref 12.0–46.0)
Lymphs Abs: 2 10*3/uL (ref 0.7–4.0)
MCHC: 33.5 g/dL (ref 30.0–36.0)
MCV: 93.3 fl (ref 78.0–100.0)
Monocytes Absolute: 1 10*3/uL (ref 0.1–1.0)
Monocytes Relative: 10 % (ref 3.0–12.0)
Neutro Abs: 6.6 10*3/uL (ref 1.4–7.7)
Neutrophils Relative %: 67.1 % (ref 43.0–77.0)
Platelets: 287 10*3/uL (ref 150.0–400.0)
RBC: 5.36 Mil/uL (ref 4.22–5.81)
RDW: 12.9 % (ref 11.5–15.5)
WBC: 9.9 10*3/uL (ref 4.0–10.5)

## 2019-07-08 LAB — BASIC METABOLIC PANEL
BUN: 18 mg/dL (ref 6–23)
CO2: 31 mEq/L (ref 19–32)
Calcium: 9.5 mg/dL (ref 8.4–10.5)
Chloride: 96 mEq/L (ref 96–112)
Creatinine, Ser: 1.39 mg/dL (ref 0.40–1.50)
GFR: 50.03 mL/min — ABNORMAL LOW (ref >60.00)
Glucose, Bld: 111 mg/dL — ABNORMAL HIGH (ref 70–99)
Potassium: 3.5 mEq/L (ref 3.5–5.1)
Sodium: 135 mEq/L (ref 135–145)

## 2019-07-08 LAB — LIPID PANEL
Cholesterol: 218 mg/dL — ABNORMAL HIGH (ref 0–200)
HDL: 66.4 mg/dL (ref >39.00)
LDL Cholesterol: 135 mg/dL — ABNORMAL HIGH (ref 0–99)
NonHDL: 152
Total CHOL/HDL Ratio: 3
Triglycerides: 84 mg/dL (ref 0.0–149.0)
VLDL: 16.8 mg/dL (ref 0.0–40.0)

## 2019-07-08 LAB — HEPATIC FUNCTION PANEL
ALT: 18 U/L (ref 0–53)
AST: 18 U/L (ref 0–37)
Albumin: 4.5 g/dL (ref 3.5–5.2)
Alkaline Phosphatase: 56 U/L (ref 39–117)
Bilirubin, Direct: 0.2 mg/dL (ref 0.0–0.3)
Total Bilirubin: 1.1 mg/dL (ref 0.2–1.2)
Total Protein: 7.4 g/dL (ref 6.0–8.3)

## 2019-07-08 LAB — TSH: TSH: 1.03 u[IU]/mL (ref 0.35–4.50)

## 2019-07-08 MED ORDER — AMLODIPINE BESYLATE 10 MG PO TABS: 10.0000 mg | ORAL_TABLET | Freq: Every day | ORAL | 3 refills | Status: DC

## 2019-07-08 MED ORDER — GENTAMICIN SULFATE 0.1 % EX OINT: 1.0000 "application " | TOPICAL_OINTMENT | Freq: Three times a day (TID) | CUTANEOUS | 0 refills | Status: DC

## 2019-07-08 NOTE — Progress Notes (Signed)
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 He also has moderate obesity with a target weight of around 171 pounds.    History of Present Illness    07/03/2017  f/u ov/Colin Palmer re: comprehensive eval for hbp Chief Complaint  Patient presents with  . Annual Exam  new c/o hem x sev months/ no bleeding  Works out tiw s cp/ no claudication / no sob/no tia rec Anusol HC up to twice daily and soaks in tub (Epson salts) work well  Call after the holidays for referral if needed  Increase toprol to 50 mg one daily  Please remember to go to the lab and x-ray department downstairs in the basement  for your tests - we will call you with the results when they are available.  Please schedule a follow up visit in 12 months but call sooner if needed     07/07/2018  f/u ov/Colin Palmer re: hbp/ ibs  Chief Complaint  Patient presents with  . Annual Exam    Pt is fasting. He states overall doing well today and no new co's.  Dyspnea:  Aerobics no problem with sob/ cp/ claudication  Cough:  no Sleeping: ok   migrating abd pain x years some better with citrucel/ diet rec No change rx     07/08/2019  f/u ov/Colin Palmer re:  HBP/ IBS Chief Complaint  Patient presents with  . Annual Exam    fasting- no co's.   Dyspnea:  Walks 4 x weekly x 30 min some hills x 2 miles  Cough: none Sleeping: flat/ one pillow  SABA use: no inhalers  02: none  Still has occ abd pain with painful bulges in supine position that he can "push away" with his hand and  HB p pizzza.   No obvious day to day or daytime variability or assoc excess/ purulent sputum or mucus plugs or hemoptysis or cp or chest tightness, subjective wheeze or overt sinus  symptoms.   Sleeping  without nocturnal  or early am exacerbation  of  respiratory  c/o's or need for noct saba. Also denies any obvious fluctuation of symptoms with weather or environmental changes or other aggravating or alleviating factors except as outlined above   No unusual exposure hx or h/o childhood pna/ asthma or knowledge of premature birth.  Current Allergies, Complete Past Medical History, Past Surgical History, Family History, and Social History were reviewed in [employer] record.  ROS  The following are not active complaints unless bolded Hoarseness, sore throat, dysphagia, dental problems, itching, sneezing,  nasal congestion or discharge of excess mucus or purulent secretions, ear ache,   fever, chills, sweats, unintended wt loss or wt gain, classically pleuritic or exertional cp,  orthopnea pnd or arm/hand swelling  or leg swelling, presyncope, palpitations, abdominal pain overall better since GI eval neg, anorexia, nausea, vomiting, diarrhea  or change in bowel habits or change in bladder habits, change in stools or change in urine, dysuria, hematuria,  rash, arthralgias, visual complaints, headache, numbness, weakness or ataxia or problems with walking or coordination,  change in mood or  Memory. Other:  Hearing loss s tinnitus/ vertigo        Current Meds  Medication Sig  . amLODipine (NORVASC) 5 MG tablet  TAKE 1 TABLET BY MOUTH EVERY DAY  . aspirin 325 MG tablet Take 325 mg by mouth daily.    . calcium carbonate (TUMS - DOSED IN MG ELEMENTAL CALCIUM) 500 MG chewable tablet Chew 1 tablet by mouth daily as needed.    . famotidine (PEPCID) 20 MG tablet Take 20 mg by mouth as needed.   . hydrocortisone (ANUSOL-HC) 2.5 % rectal cream Place 1 application rectally 2 (two) times daily.  Marland Kitchen. ibuprofen (ADVIL) 200 MG tablet Take 200 mg by mouth every 6 (six) hours as needed.    . methylcellulose (CITRUCEL) oral powder Take 1 packet by mouth daily.    . metoprolol succinate (TOPROL-XL) 50 MG 24 hr tablet TAKE WITH OR IMMEDIATELY  FOLLOWING A MEAL.  Marland Kitchen. metroNIDAZOLE (METROCREAM) 0.75 % cream Apply 1 application topically 2 (two) times daily.  . Omega-3 Fatty Acids (FISH OIL) 1000 MG CAPS Take 1 capsule by mouth 3 (three) times daily.   Colin Palmer. Polyethyl Glycol-Propyl Glycol (SYSTANE OP) Apply 3-4 drops to eye daily.  . valsartan-hydrochlorothiazide (DIOVAN HCT) 160-12.5 MG tablet Take 1 tablet by mouth daily.                      Past Medical History:  OVERWEIGHT (ICD-278.02)  - Target wt = 185 for BMI < 30, ideal 166 for BMI < 26  DIVERTICULOSIS OF COLON (ICD-562.10)...................................Marland Kitchen.   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)  CHEST PAIN, ATYPICAL (ICD-786.59)  HYPERTENSION (ICD-401.9)  Fair complexion Pos Ak's.......................................................................Marland Kitchen.Colin Palmer  HEALTH MAINTENANCE.........................................................................Marland Kitchen.Colin Palmer  - Tdap  02/24/2013  - Pneumovax 01/2012,  05/26/2014  Prevar - CPX  07/08/2019      Family History:  lung cancer in father  ovarian cancer in mother  no siblings  No FH of Colon Cancer   Social History:  denies ever smoking  Alcohol Use - yes: one glass of wine daily  Illicit Drug Use - no  Daily Caffeine Use: one cup of coffee daily             Objective:   Physical Exam   Amb wm nad  Vital signs reviewed - Note on arrival 02 sats  99% on RA   And   BP 170/88      07/08/2019    186 wt 194 August 09, 2008 > 179 June 29, 2009 >  02/05/2011 175> 02/19/2012  176> 11/06/2012  177 > 02/24/2013  174> 12/01/2013 178  > 05/26/2014 178 > 07/03/2015   183 > 07/02/2016  181 >  07/07/2018  186      HEENT : pt wearing mask not removed for exam due to covid -19 concerns. Ears  clear/ L medial blepharitis noted s conjunctival erythema or chemosis.   NECK :  without JVD/Nodes/TM/ nl carotid upstrokes bilaterally   LUNGS: no acc muscle use,  Nl contour chest which is clear to A and P bilaterally without cough on insp or exp maneuvers   CV:  RRR  no s3 or murmur or increase in P2, and no edema   ABD:  soft and nontender with nl inspiratory excursion in the supine position. No bruits or organomegaly appreciated, bowel sounds nl  MS:  Nl gait/ ext warm without deformities, calf tenderness, cyanosis or clubbing No obvious joint restrictions   SKIN: warm  and dry without lesions    NEURO:  alert, approp, nl sensorium with  no motor or cerebellar deficits apparent  GU  Testes down bilaterally  Neg IH  Rectal:  Very mild BPH changes s nodules,  Stool G neg    EKG  07/08/2019   NSR wnl     CXR PA and Lateral:   07/08/2019 :    I personally reviewed images and agree with radiology impression as follows:   No active cardiopulmonary disease.   Labs ordered/ reviewed:      Chemistry      Component Value Date/Time   NA 135 07/08/2019 1142   K 3.5 07/08/2019 1142   CL 96 07/08/2019 1142   CO2 31 07/08/2019 1142   BUN 18 07/08/2019 1142   CREATININE 1.39 07/08/2019 1142      Component Value Date/Time   CALCIUM 9.5 07/08/2019 1142   ALKPHOS 56 07/08/2019 1142   AST 18 07/08/2019 1142   ALT 18 07/08/2019 1142   BILITOT 1.1 07/08/2019 1142        Lab Results  Component Value Date   WBC 9.9 07/08/2019   HGB 16.7 07/08/2019   HCT 50.0 07/08/2019   MCV 93.3 07/08/2019   PLT 287.0 07/08/2019       EOS                                                              0.1                                     07/08/2019     Lab Results  Component Value Date   TSH 1.03 07/08/2019                          Assessment:

## 2019-07-08 NOTE — Patient Instructions (Addendum)
Increase amlopidine to 10 mg daily   We will call to refer you to Dr Thornell Mule for hearing evaluation  GERD (REFLUX)  is an extremely common cause of respiratory symptoms just like yours , many times with no obvious heartburn at all.    It can be treated with medication, but also with lifestyle changes including elevation of the head of your bed (ideally with 6 -8inch blocks under the headboard of your bed),  Smoking cessation, avoidance of late meals, excessive alcohol, and avoid fatty foods, chocolate, peppermint, colas, red wine, and acidic juices such as orange juice.  NO MINT OR MENTHOL PRODUCTS SO NO COUGH DROPS  USE SUGARLESS CANDY INSTEAD (Jolley ranchers or Stover's or Life Savers) or even ice chips will also do - the key is to swallow to prevent all throat clearing. NO OIL BASED VITAMINS - use powdered substitutes.  Avoid fish oil when coughing.    Please remember to go to the lab and x-ray department   for your tests - we will call you with the results when they are available.   Please schedule a follow up visit in 12  months but call sooner if needed

## 2019-07-08 NOTE — Telephone Encounter (Signed)
DG Chest 2 View: Result Notes  Tanda Rockers, MD  07/08/2019 2:53 PM EST    Call pt: Reviewed cxr and no acute change so no change in recommendations made at ov   Result Notes  Tanda Rockers, MD  07/08/2019 2:54 PM EST    Call patient : Studies are ok - the ldl is a bit high but so is the hdl which is a very good sign, no change recs = diet/ exercise   Spoke with pt and notified of results per Dr. Melvyn Novas. Pt verbalized understanding and denied any questions.

## 2019-07-09 ENCOUNTER — Encounter: Payer: Self-pay | Admitting: Internal Medicine

## 2019-07-09 DIAGNOSIS — H9190 Unspecified hearing loss, unspecified ear: Secondary | ICD-10-CM | POA: Insufficient documentation

## 2019-07-09 MED ORDER — VALSARTAN-HYDROCHLOROTHIAZIDE 160-12.5 MG PO TABS: 1.0000 | ORAL_TABLET | Freq: Every day | ORAL | 1 refills | Status: DC

## 2019-07-09 NOTE — Telephone Encounter (Signed)
Patient is returning phone call.  Patient phone number is [phone].

## 2019-07-09 NOTE — Assessment & Plan Note (Signed)
Target LDL < 130 due to hbp/ male gender/ type A  Lab Results  Component Value Date   CHOL 218 (H) 07/08/2019   HDL 66.40 07/08/2019   LDLCALC 135 (H) 07/08/2019   LDLDIRECT 125.0 01/26/2010   TRIG 84.0 07/08/2019   CHOLHDL 3 07/08/2019   HDL protective, no need to change rx

## 2019-07-09 NOTE — Assessment & Plan Note (Signed)
Reported 07/08/2019 > nl exam > referred to Dr Thornell Mule

## 2019-07-09 NOTE — Assessment & Plan Note (Addendum)
-   abd u/s neg 12/02/2013 x lots of gas > better with citrucel  - Referred to GI 12/01/13 > no w/u warranted     Also reports what may be gerd symptoms p big meals esp pizza > rec gerd diet and pepcid prn with prilosec another option and f/u prn

## 2019-07-09 NOTE — Assessment & Plan Note (Signed)
Declined psa screening 02/18/2012 and 02/24/13 and 05/26/14   Has nl exam and > 73 years of age, white, neg fm hx > no further w/u

## 2019-07-09 NOTE — Assessment & Plan Note (Signed)
Lab Results  Component Value Date   CREATININE 1.39 07/08/2019   CREATININE 1.23 07/07/2018   CREATININE 1.21 07/03/2017     Not optimally controlled on present regimen. I reviewed this with the patient and emphasized importance of diet/ ex and increased the amlodipine to 10 mg daily as he appears adequately beta blocked and euvolemic and advised self monitoring with target SBP < 135

## 2019-07-09 NOTE — Telephone Encounter (Signed)
Called and spoke to patient. Relayed results per Dr. Melvyn Novas. Patient also stated that he did not get a refill on his Diovan and asked for that to be sent in.  Rx sent to pharmacy of choice.  Nothing further needed at this time.

## 2019-07-12 ENCOUNTER — Other Ambulatory Visit: Payer: Self-pay | Admitting: *Deleted

## 2019-07-12 DIAGNOSIS — H9190 Unspecified hearing loss, unspecified ear: Secondary | ICD-10-CM

## 2019-08-21 ENCOUNTER — Ambulatory Visit: Payer: Medicare Other

## 2019-08-26 ENCOUNTER — Ambulatory Visit: Payer: Medicare Other

## 2019-12-28 ENCOUNTER — Other Ambulatory Visit: Payer: Self-pay | Admitting: Internal Medicine

## 2020-06-20 ENCOUNTER — Encounter: Payer: Self-pay | Admitting: Gastroenterology

## 2020-06-26 ENCOUNTER — Other Ambulatory Visit: Payer: Self-pay | Admitting: Internal Medicine

## 2020-06-26 DIAGNOSIS — I10 Essential (primary) hypertension: Secondary | ICD-10-CM

## 2020-07-04 ENCOUNTER — Other Ambulatory Visit: Payer: Self-pay

## 2020-07-04 ENCOUNTER — Ambulatory Visit (AMBULATORY_SURGERY_CENTER): Payer: Self-pay | Admitting: *Deleted

## 2020-07-04 ENCOUNTER — Other Ambulatory Visit: Payer: Self-pay | Admitting: Internal Medicine

## 2020-07-04 VITALS — Ht 66.0 in | Wt 185.0 lb

## 2020-07-04 DIAGNOSIS — Z1211 Encounter for screening for malignant neoplasm of colon: Secondary | ICD-10-CM

## 2020-07-04 MED ORDER — METOPROLOL SUCCINATE ER 50 MG PO TB24: ORAL_TABLET | ORAL | 0 refills | Status: DC

## 2020-07-04 MED ORDER — SUTAB 1479-225-188 MG PO TABS: 1.0000 | ORAL_TABLET | Freq: Once | ORAL | 0 refills | Status: AC

## 2020-07-04 NOTE — Progress Notes (Signed)

## 2020-07-07 ENCOUNTER — Ambulatory Visit: Payer: Medicare Other | Admitting: Internal Medicine

## 2020-07-10 ENCOUNTER — Ambulatory Visit: Payer: Medicare Other | Admitting: Internal Medicine

## 2020-07-18 IMAGING — DX DG CHEST 2V
2 series · 2 of 2 positions shown · non-contrast
Comparison: 07/03/2017

CLINICAL DATA: Hypertension.

EXAM:
CHEST - 2 VIEW

[chest pa]
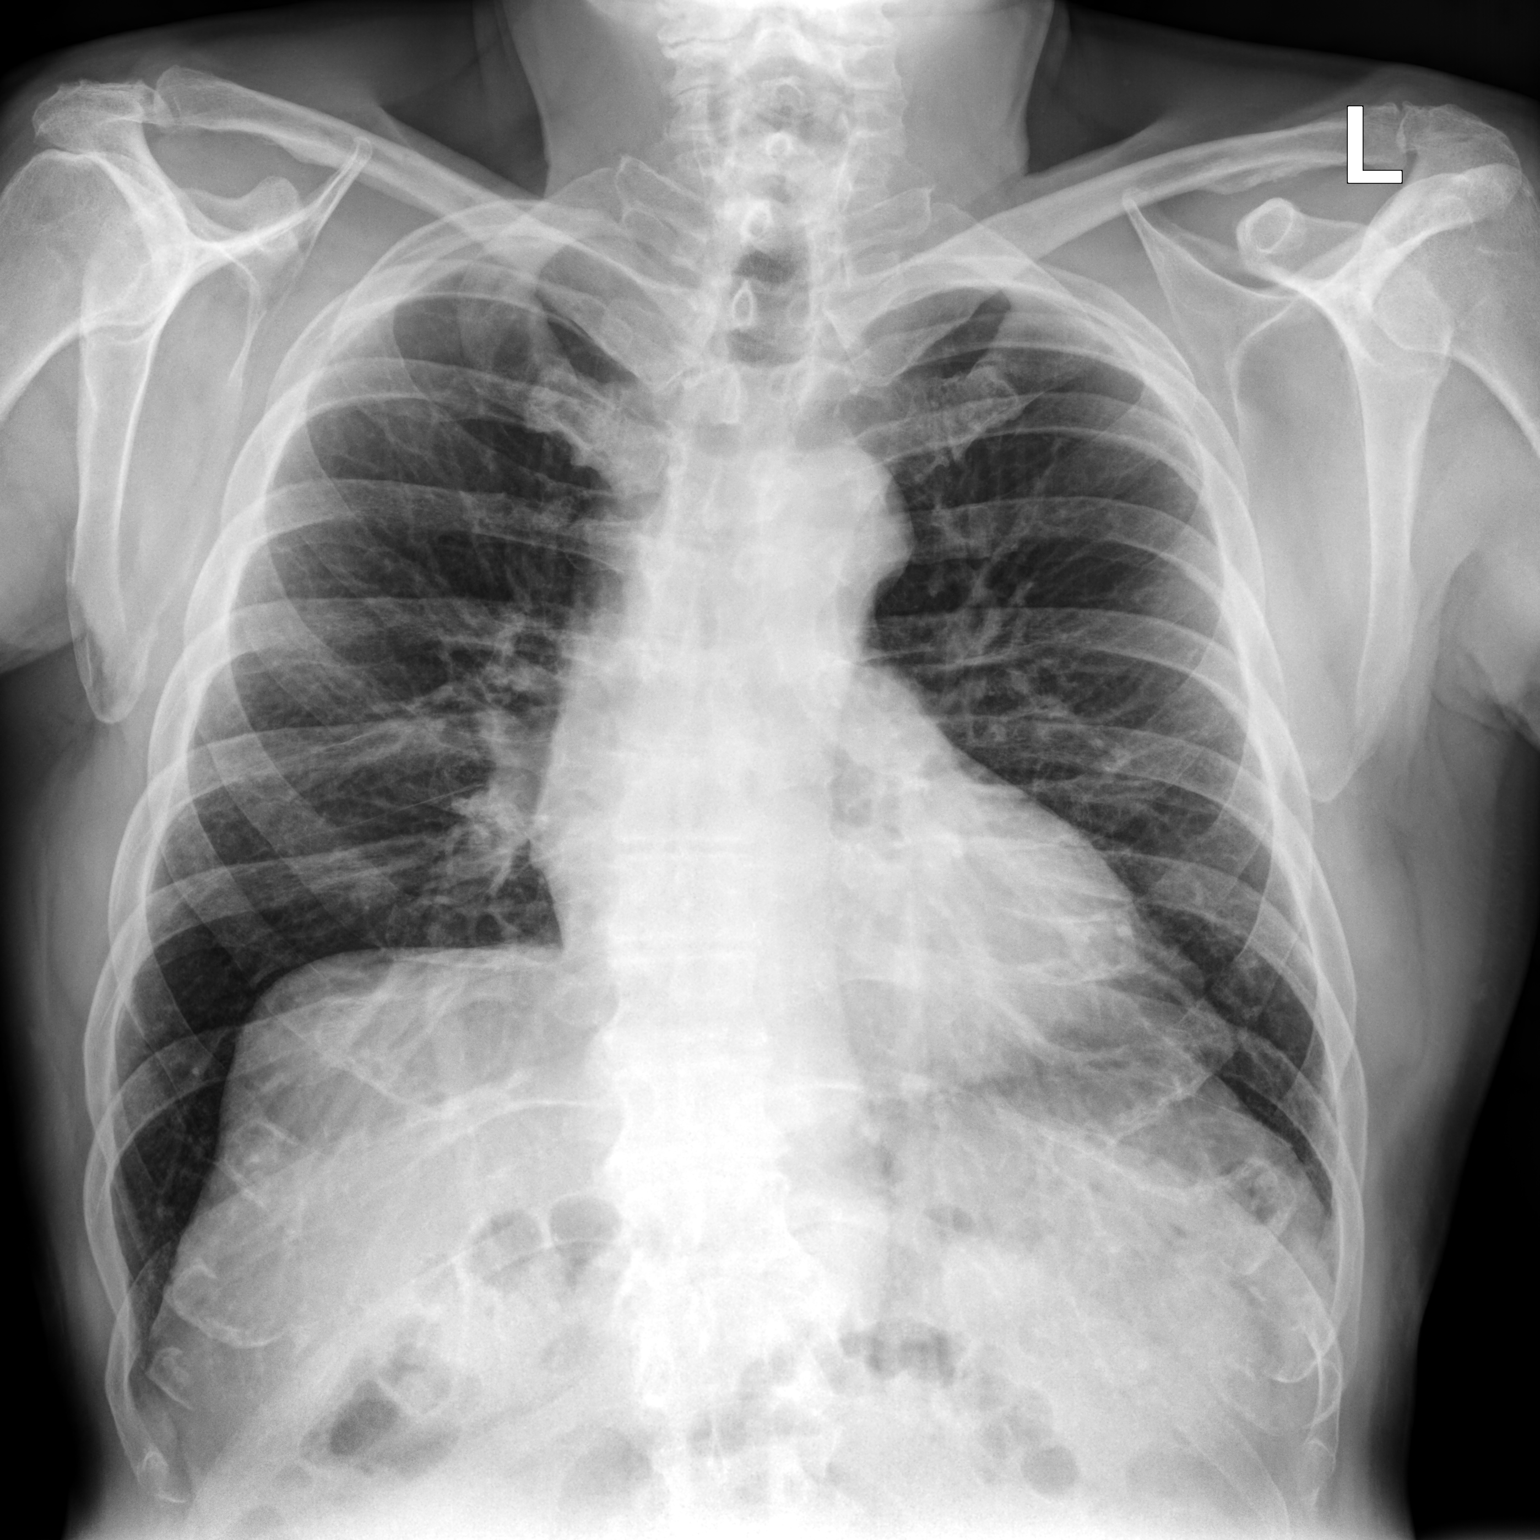

[chest lat]
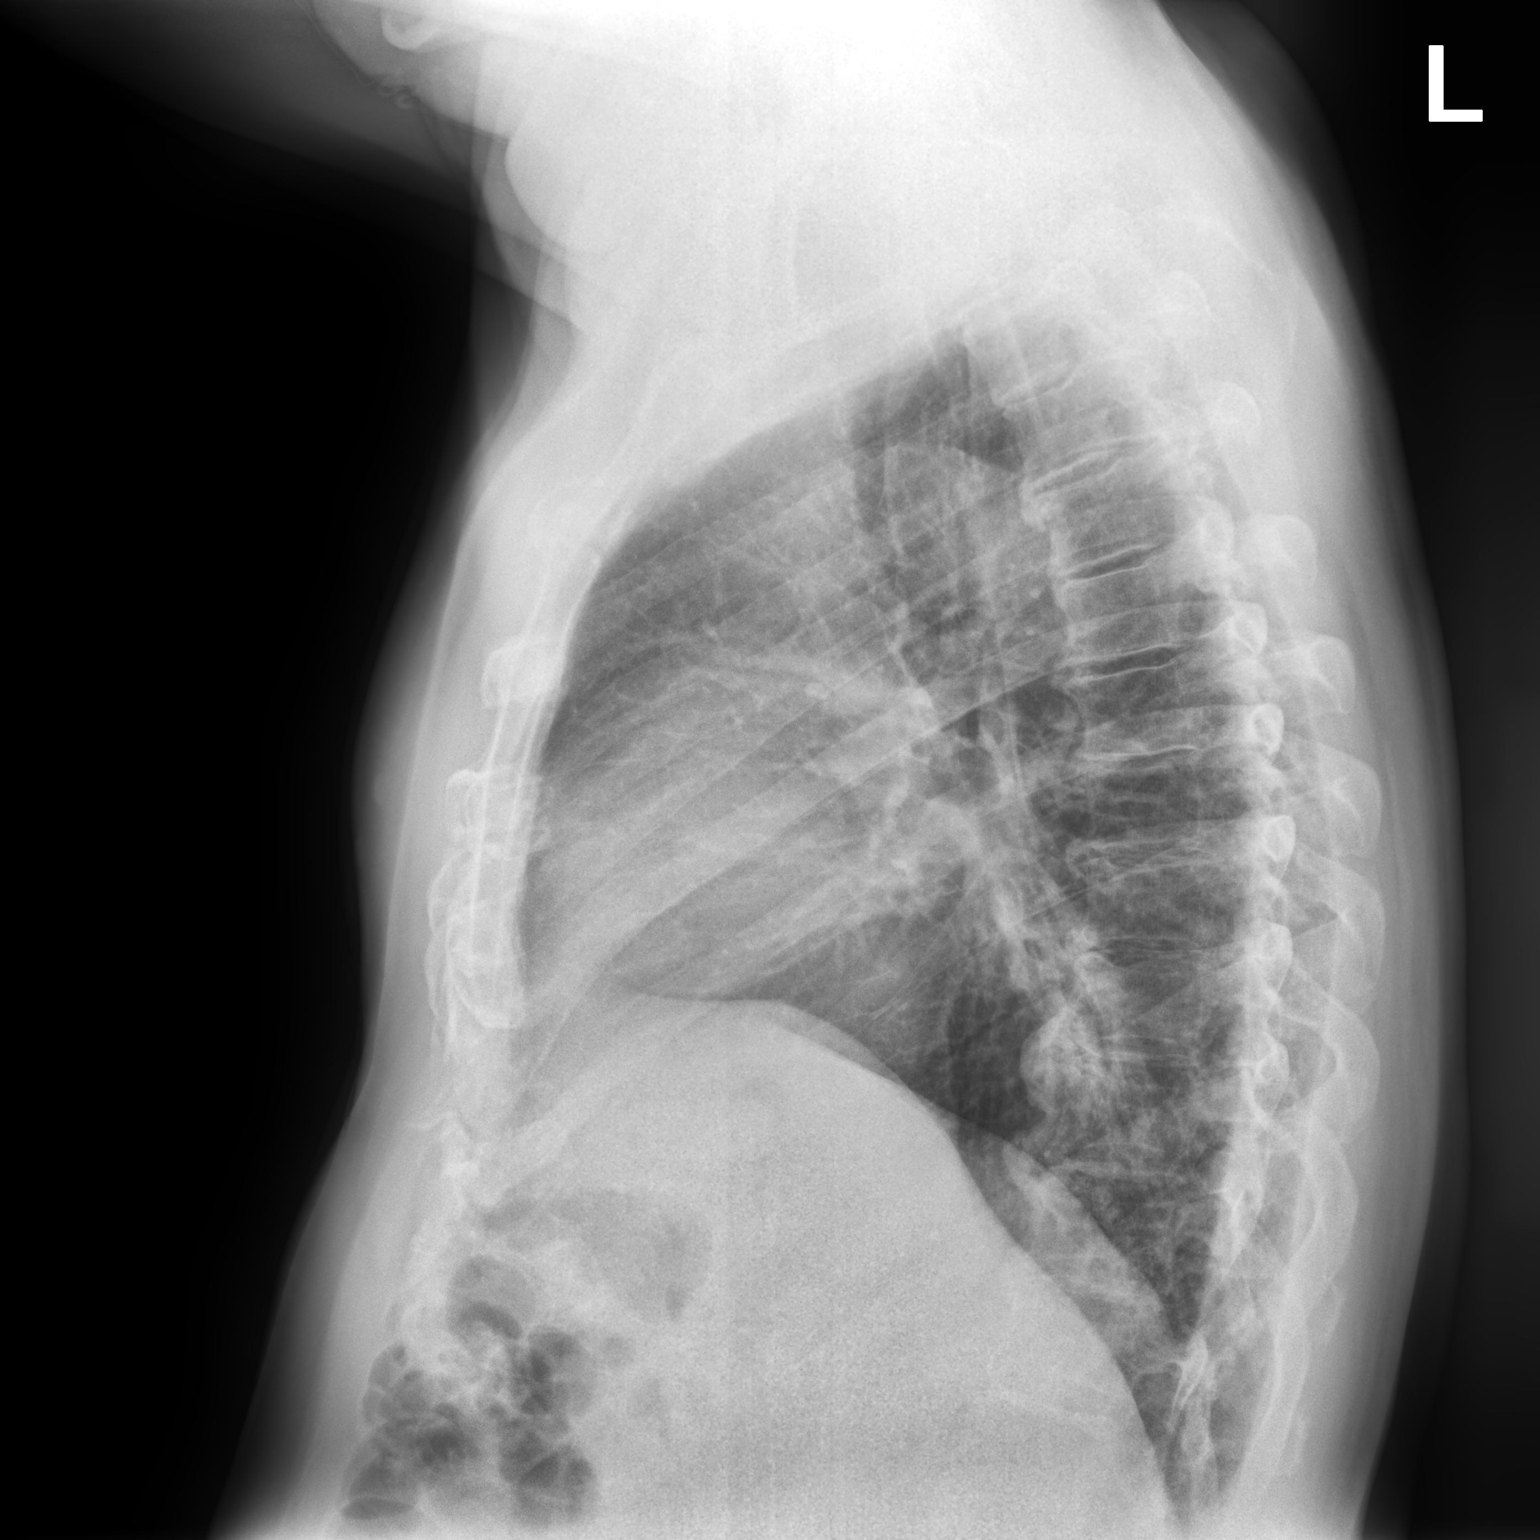

[2 of 2 positions shown; findings below may reference images not displayed]

FINDINGS: The heart size and mediastinal contours are within normal limits.
Both lungs are clear. Mild thoracic dextroscoliosis remains stable.
IMPRESSION: No active cardiopulmonary disease.

## 2020-07-24 ENCOUNTER — Encounter: Payer: Self-pay | Admitting: *Deleted

## 2020-07-24 ENCOUNTER — Other Ambulatory Visit: Payer: Self-pay

## 2020-07-24 ENCOUNTER — Encounter: Payer: Self-pay | Admitting: Internal Medicine

## 2020-07-24 ENCOUNTER — Ambulatory Visit (INDEPENDENT_AMBULATORY_CARE_PROVIDER_SITE_OTHER): Payer: Medicare Other | Admitting: Internal Medicine

## 2020-07-24 VITALS — BP 130/70 | HR 81 | Ht 67.0 in | Wt 186.0 lb

## 2020-07-24 DIAGNOSIS — E785 Hyperlipidemia, unspecified: Secondary | ICD-10-CM

## 2020-07-24 DIAGNOSIS — I10 Essential (primary) hypertension: Secondary | ICD-10-CM

## 2020-07-24 DIAGNOSIS — K588 Other irritable bowel syndrome: Secondary | ICD-10-CM

## 2020-07-24 LAB — URINALYSIS
Bilirubin Urine: NEGATIVE
Hgb urine dipstick: NEGATIVE
Ketones, ur: NEGATIVE
Leukocytes,Ua: NEGATIVE
Nitrite: NEGATIVE
Specific Gravity, Urine: 1.01 (ref 1.000–1.030)
Total Protein, Urine: NEGATIVE
Urine Glucose: NEGATIVE
Urobilinogen, UA: 0.2 (ref 0.0–1.0)
pH: 7 (ref 5.0–8.0)

## 2020-07-24 LAB — LIPID PANEL
Cholesterol: 236 mg/dL — ABNORMAL HIGH (ref 0–200)
HDL: 71.2 mg/dL (ref >39.00)
LDL Cholesterol: 136 mg/dL — ABNORMAL HIGH (ref 0–99)
NonHDL: 164.43
Total CHOL/HDL Ratio: 3
Triglycerides: 141 mg/dL (ref 0.0–149.0)
VLDL: 28.2 mg/dL (ref 0.0–40.0)

## 2020-07-24 LAB — HEPATIC FUNCTION PANEL
ALT: 22 U/L (ref 0–53)
AST: 23 U/L (ref 0–37)
Albumin: 4.6 g/dL (ref 3.5–5.2)
Alkaline Phosphatase: 52 U/L (ref 39–117)
Bilirubin, Direct: 0.2 mg/dL (ref 0.0–0.3)
Total Bilirubin: 1 mg/dL (ref 0.2–1.2)
Total Protein: 7.2 g/dL (ref 6.0–8.3)

## 2020-07-24 LAB — CBC WITH DIFFERENTIAL/PLATELET
Basophils Absolute: 0.1 10*3/uL (ref 0.0–0.1)
Basophils Relative: 1 % (ref 0.0–3.0)
Eosinophils Absolute: 0.2 10*3/uL (ref 0.0–0.7)
Eosinophils Relative: 1.8 % (ref 0.0–5.0)
HCT: 46.4 % (ref 39.0–52.0)
Hemoglobin: 16.2 g/dL (ref 13.0–17.0)
Lymphocytes Relative: 23 % (ref 12.0–46.0)
Lymphs Abs: 2 10*3/uL (ref 0.7–4.0)
MCHC: 35 g/dL (ref 30.0–36.0)
MCV: 91.3 fl (ref 78.0–100.0)
Monocytes Absolute: 0.9 10*3/uL (ref 0.1–1.0)
Monocytes Relative: 10.6 % (ref 3.0–12.0)
Neutro Abs: 5.5 10*3/uL (ref 1.4–7.7)
Neutrophils Relative %: 63.6 % (ref 43.0–77.0)
Platelets: 289 10*3/uL (ref 150.0–400.0)
RBC: 5.09 Mil/uL (ref 4.22–5.81)
RDW: 12.7 % (ref 11.5–15.5)
WBC: 8.7 10*3/uL (ref 4.0–10.5)

## 2020-07-24 LAB — BASIC METABOLIC PANEL
BUN: 15 mg/dL (ref 6–23)
CO2: 29 mEq/L (ref 19–32)
Calcium: 9.4 mg/dL (ref 8.4–10.5)
Chloride: 99 mEq/L (ref 96–112)
Creatinine, Ser: 1.29 mg/dL (ref 0.40–1.50)
GFR: 54.63 mL/min — ABNORMAL LOW (ref >60.00)
Glucose, Bld: 115 mg/dL — ABNORMAL HIGH (ref 70–99)
Potassium: 3.8 mEq/L (ref 3.5–5.1)
Sodium: 135 mEq/L (ref 135–145)

## 2020-07-24 LAB — TSH: TSH: 1.29 u[IU]/mL (ref 0.35–4.50)

## 2020-07-24 MED ORDER — VALSARTAN-HYDROCHLOROTHIAZIDE 160-25 MG PO TABS: 1.0000 | ORAL_TABLET | Freq: Every day | ORAL | 2 refills | Status: DC

## 2020-07-24 NOTE — Progress Notes (Signed)
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 He also has moderate obesity with a target weight of around 171 pounds.    History of Present Illness    07/03/2017  f/u ov/Colin Palmer re: comprehensive eval for hbp Chief Complaint  Patient presents with  . Annual Exam  new c/o hem x sev months/ no bleeding  Works out tiw s cp/ no claudication / no sob/no tia rec Anusol HC up to twice daily and soaks in tub (Epson salts) work well  Call after the holidays for referral if needed  Increase toprol to 50 mg one daily  Please remember to go to the lab and x-ray department downstairs in the basement  for your tests - we will call you with the results when they are available.  Please schedule a follow up visit in 12 months but call sooner if needed     07/07/2018  f/u ov/Colin Palmer re: hbp/ ibs  Chief Complaint  Patient presents with  . Annual Exam    Pt is fasting. He states overall doing well today and no new co's.  Dyspnea:  Aerobics no problem with sob/ cp/ claudication  Cough:  no Sleeping: ok   migrating abd pain x years some better with citrucel/ diet rec No change rx     07/08/2019  f/u ov/Colin Palmer re:  HBP/ IBS Chief Complaint  Patient presents with  . Annual Exam    fasting- no co's.   Dyspnea:  Walks 4 x weekly x 30 min some hills x 2 miles  Cough: none Sleeping: flat/ one pillow  SABA use: no inhalers  02: none  Still has occ abd pain with painful bulges in supine position that he can "push away" with his hand and  HB p pizzza.  rec Increase amlopidine to 10 mg daily  We will call to refer you to Dr Thornell Mule for hearing evaluation GERD diet Please remember to go to the lab and x-ray department   for your tests - we will call you with the results when they are  available.    07/24/2020  f/u ov/Colin Palmer re:  HBP/ IBS  Chief Complaint  Patient presents with  . Follow-up    Doing well/ No complaints  Dyspnea: still does power walks  - no cp /sob / tia/ claudication  Cough: no  Sleeping: no problem SABA use: none  02: none    No obvious day to day or daytime variability or assoc excess/ purulent sputum or mucus plugs or hemoptysis or cp or chest tightness, subjective wheeze or overt sinus or hb symptoms.   Sleeping  without nocturnal  or early am exacerbation  of respiratory  c/o's or need for noct saba. Also denies any obvious fluctuation of symptoms with weather or environmental changes or other aggravating or alleviating factors except as outlined above   No unusual exposure hx or h/o childhood pna/ asthma or knowledge of premature birth.  Current Allergies, Complete Past Medical History, Past Surgical History, Family History, and Social History were reviewed in [employer] record.  ROS  The following are not active complaints unless bolded Hoarseness, sore throat, dysphagia, dental problems, itching, sneezing,  nasal congestion or discharge of excess mucus or purulent secretions, ear ache,  fever, chills, sweats, unintended wt loss or wt gain, classically pleuritic or exertional cp,  orthopnea pnd or arm/hand swelling  or leg swelling, presyncope, palpitations, abdominal pain, anorexia, nausea, vomiting, diarrhea  or change in bowel habits or change in bladder habits, change in stools or change in urine, dysuria, hematuria,  rash, arthralgias, visual complaints, headache, numbness, weakness or ataxia or problems with walking or coordination,  change in mood or  memory.        Current Meds  Medication Sig  . amLODipine (NORVASC) 10 MG tablet TAKE 1 TABLET BY MOUTH EVERY DAY  . aspirin 325 MG tablet Take 325 mg by mouth daily.  . famotidine (PEPCID) 20 MG tablet Take 20 mg by mouth as needed.   . methylcellulose oral powder  Take 1 packet by mouth daily.  . metoprolol succinate (TOPROL-XL) 50 MG 24 hr tablet Take with or immediately following a meal.  . Omega-3 Fatty Acids (FISH OIL) 1000 MG CAPS Take 1 capsule by mouth 3 (three) times daily.  Bertram Gala Glycol-Propyl Glycol (SYSTANE OP) Apply 3-4 drops to eye daily.  . 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 [name]  HEALTH MAINTENANCE.........................................................................Marland KitchenWert  - Tdap  02/24/2013  - Pneumovax 01/2012,  05/26/2014  Prevar - CPX  07/24/2020       Family History:  lung cancer in father  ovarian cancer in mother  no siblings  No FH of Colon Cancer   Social History:  denies ever smoking  Alcohol Use - yes: one glass of wine daily  Illicit Drug Use - no  Daily Caffeine Use: one cup of coffee daily             Objective:   Physical Exam       07/24/2020        187 07/08/2019    186 wt 194 August 09, 2008 > 179 June 29, 2009 >  02/05/2011 175> 02/19/2012  176> 11/06/2012  177 > 02/24/2013  174> 12/01/2013 178  > 05/26/2014 178 > 07/03/2015   183 > 07/02/2016  181 >  07/07/2018  186     Vital signs reviewed  07/24/2020  - Note at rest 02 sats  97% on RA   General appearance:    Well preserved amb wm nad   HEENT : pt wearing mask  not removed for exam due to covid -19 concerns.    NECK :  without JVD/Nodes/TM/ nl carotid upstrokes bilaterally   LUNGS: no acc muscle use,  Nl contour chest which is clear to A and P bilaterally without cough on insp or exp maneuvers   CV:  RRR  no s3 or murmur or increase in P2, and trace to 1+ bilateral ankle  edema   ABD:  soft and nontender with nl inspiratory excursion in the supine position. No bruits or organomegaly appreciated, bowel sounds nl  MS:  Nl gait/ ext  warm without deformities, calf tenderness, cyanosis or clubbing No obvious joint restrictions   SKIN: warm and dry without lesions    NEURO:  alert, approp, nl sensorium with  no motor or cerebellar deficits apparent.   Recatal:  Per GI 07/25/2020 for colonoscopy    Labs ordered/ reviewed:      Chemistry      Component Value Date/Time   NA 135 07/24/2020 1038   K 3.8 07/24/2020 1038   CL 99 07/24/2020 1038   CO2 29 07/24/2020 1038   BUN 15 07/24/2020 1038   CREATININE 1.29 07/24/2020 1038      Component Value Date/Time   CALCIUM 9.4 07/24/2020 1038   ALKPHOS 52 07/24/2020 1038   AST 23 07/24/2020 1038   ALT 22 07/24/2020 1038   BILITOT 1.0 07/24/2020 1038        Lab Results  Component Value Date   WBC 8.7 07/24/2020   HGB 16.2 07/24/2020   HCT 46.4 07/24/2020   MCV 91.3 07/24/2020   PLT 289.0 07/24/2020     No results found for: DDIMER    Lab Results  Component Value Date   TSH 1.29 07/24/2020     No results found for: PROBNP    No results found for: [facility]      Lab Results  Component Value Date   CHOL 236 (H) 07/24/2020   HDL 71.20 07/24/2020   LDLCALC 136 (H) 07/24/2020   LDLDIRECT 125.0 01/26/2010   TRIG 141.0 07/24/2020   CHOLHDL 3 07/24/2020                         Assessment:

## 2020-07-24 NOTE — Patient Instructions (Signed)
Next refill Increase the diovan to the 160 25 strength and if bp too low (below 110 on top light headed standing then take half of the amlopidine  Please remember to go to the lab department   for your tests - we will call you with the results when they are available.   CPX due in a year.

## 2020-07-25 ENCOUNTER — Ambulatory Visit (AMBULATORY_SURGERY_CENTER): Payer: Medicare Other | Admitting: Gastroenterology

## 2020-07-25 ENCOUNTER — Encounter: Payer: Self-pay | Admitting: Internal Medicine

## 2020-07-25 ENCOUNTER — Encounter: Payer: Self-pay | Admitting: Gastroenterology

## 2020-07-25 VITALS — BP 109/76 | HR 63 | Temp 97.3°F | Resp 15 | Ht 68.0 in | Wt 185.0 lb

## 2020-07-25 DIAGNOSIS — Z1211 Encounter for screening for malignant neoplasm of colon: Secondary | ICD-10-CM

## 2020-07-25 MED ORDER — SODIUM CHLORIDE 0.9 % IV SOLN: 500.0000 mL | INTRAVENOUS | Status: DC

## 2020-07-25 NOTE — Assessment & Plan Note (Signed)
Followed as Primary Care Patient/ [facility]/ Colin Palmer   Lab Results  Component Value Date   CREATININE 1.29 07/24/2020   CREATININE 1.39 07/08/2019   CREATININE 1.23 07/07/2018     U/a neg for protein  He does have a tendency to  A bit more leg swelling on high dose norvasc so will increase the hctz to 25 mg from 12.5 and if bp too low go back to 5 mg norvasc

## 2020-07-25 NOTE — Op Note (Signed)
Colin Palmer Procedure Date: 07/25/2020 10:03 AM MRN: [id number] Endoscopist: Remo Lipps P. Havery Moros , MD Age: 75 Referring MD:  Date of Birth: 11-12-1945 Gender: Male Account #: [id number] Procedure:                Colonoscopy Indications:              Screening for colorectal malignant neoplasm Medicines:                Monitored Anesthesia Care Procedure:                Pre-Anesthesia Assessment:                           - Prior to the procedure, a History and Physical                            was performed, and patient medications and                            allergies were reviewed. The patient's tolerance of                            previous anesthesia was also reviewed. The risks                            and benefits of the procedure and the sedation                            options and risks were discussed with the patient.                            All questions were answered, and informed consent                            was obtained. Prior Anticoagulants: The patient has                            taken no previous anticoagulant or antiplatelet                            agents. ASA Grade Assessment: II - A patient with                            mild systemic disease. After reviewing the risks                            and benefits, the patient was deemed in                            satisfactory condition to undergo the procedure.                           After obtaining informed consent, the colonoscope  was passed under direct vision. Throughout the                            procedure, the patient's blood pressure, pulse, and                            oxygen saturations were monitored continuously. The                            Olympus CF-HQ190L [id number]) Colonoscope was                            introduced through the anus and advanced to the the                            cecum, identified  by appendiceal orifice and                            ileocecal valve. The colonoscopy was performed                            without difficulty. The patient tolerated the                            procedure well. The quality of the bowel                            preparation was adequate. The ileocecal valve,                            appendiceal orifice, and rectum were photographed. Scope In: 10:15:27 AM Scope Out: 10:36:08 AM Scope Withdrawal Time: 0 hours 17 minutes 38 seconds  Total Procedure Duration: 0 hours 20 minutes 41 seconds  Findings:                 The perianal and digital rectal examinations were                            normal.                           Multiple small-mouthed diverticula were found in                            the sigmoid colon.                           Internal hemorrhoids were found during retroflexion.                           The exam was otherwise without abnormality. No                            polyps. Complications:            No immediate complications. Estimated blood loss:  None. Estimated Blood Loss:     Estimated blood loss: none. Impression:               - Diverticulosis in the sigmoid colon.                           - Internal hemorrhoids.                           - The examination was otherwise normal.                           - No polyps Recommendation:           - Patient has a contact number available for                            emergencies. The signs and symptoms of potential                            delayed complications were discussed with the                            patient. Return to normal activities tomorrow.                            Written discharge instructions were provided to the                            patient.                           - Resume previous diet.                           - Continue present medications.                           - No further  colonoscopy exams are recommended for                            routine colon cancer screening given age (would not                            be next due until age 85) Willaim Rayas. Jeniyah Menor, MD 07/25/2020 10:39:47 AM This report has been signed electronically.

## 2020-07-25 NOTE — Assessment & Plan Note (Signed)
Target LDL < 130 due to hbp/ male gender/ type A  LDL 136 but HDL is quite high @ 71  > no change in rx needed

## 2020-07-25 NOTE — Progress Notes (Signed)
To PACU, VSS. Report to [email or web address] 

## 2020-07-25 NOTE — Assessment & Plan Note (Addendum)
-   abd u/s neg 12/02/2013 x lots of gas > better with citrucel  - Referred to GI 12/01/13 > no w/u warranted   For gi eval  07/25/2020  > this problem has improved >>  f/u GI prn   F/u here  q 12 m  For CPX          Each maintenance medication was reviewed in detail including emphasizing most importantly the difference between maintenance and prns and under what circumstances the prns are to be triggered using an action plan format where appropriate.  Total time for H and P, chart review, counseling,  and generating customized AVS unique to this office visit / charting = 20 min

## 2020-07-25 NOTE — Patient Instructions (Signed)
? ? ?  Handouts on diverticulosis & hemorrhoids given to you today ? ? ? ?YOU HAD AN ENDOSCOPIC PROCEDURE TODAY AT THE [facility]:   Refer to the procedure report that was given to you for any specific questions about what was found during the examination.  If the procedure report does not answer your questions, please call your gastroenterologist to clarify.  If you requested that your care partner not be given the details of your procedure findings, then the procedure report has been included in a sealed envelope for you to review at your convenience later. ? ?YOU SHOULD EXPECT: Some feelings of bloating in the abdomen. Passage of more gas than usual.  Walking can help get rid of the air that was put into your GI tract during the procedure and reduce the bloating. If you had a lower endoscopy (such as a colonoscopy or flexible sigmoidoscopy) you may notice spotting of blood in your stool or on the toilet paper. If you underwent a bowel prep for your procedure, you may not have a normal bowel movement for a few days. ? ?Please Note:  You might notice some irritation and congestion in your nose or some drainage.  This is from the oxygen used during your procedure.  There is no need for concern and it should clear up in a day or so. ? ?SYMPTOMS TO REPORT IMMEDIATELY: ? ?Following lower endoscopy (colonoscopy or flexible sigmoidoscopy): ? Excessive amounts of blood in the stool ? Significant tenderness or worsening of abdominal pains ? Swelling of the abdomen that is new, acute ? Fever of 100?F or higher ? ? ?For urgent or emergent issues, a gastroenterologist can be reached at any hour by calling [phone]. ?Do not use MyChart messaging for urgent concerns.  ? ? ?DIET:  We do recommend a small meal at first, but then you may proceed to your regular diet.  Drink plenty of fluids but you should avoid alcoholic beverages for 24 hours. ? ?ACTIVITY:  You should plan to take it easy for the rest of today  and you should NOT DRIVE or use heavy machinery until tomorrow (because of the sedation medicines used during the test).   ? ?FOLLOW UP: ?Our staff will call the number listed on your records 48-72 hours following your procedure to check on you and address any questions or concerns that you may have regarding the information given to you following your procedure. If we do not reach you, we will leave a message.  We will attempt to reach you two times.  During this call, we will ask if you have developed any symptoms of COVID 19. If you develop any symptoms (ie: fever, flu-like symptoms, shortness of breath, cough etc.) before then, please call [phone].  If you test positive for Covid 19 in the 2 weeks post procedure, please call and report this information to us.   ? ?If any biopsies were taken you will be contacted by phone or by letter within the next 1-3 weeks.  Please call us at [phone] if you have not heard about the biopsies in 3 weeks.  ? ? ?SIGNATURES/CONFIDENTIALITY: ?You and/or your care partner have signed paperwork which will be entered into your electronic medical record.  These signatures attest to the fact that that the information above on your After Visit Summary has been reviewed and is understood.  Full responsibility of the confidentiality of this discharge information lies with you and/or your care-partner.  ?

## 2020-07-27 ENCOUNTER — Telehealth: Payer: Self-pay

## 2020-07-27 NOTE — Telephone Encounter (Signed)
  Follow up Call-  Call back number 07/25/2020  Post procedure Call Back phone  # [phone]  Permission to leave phone message Yes  Some recent data might be hidden     Patient questions:  Do you have a fever, pain , or abdominal swelling? No. Pain Score  0 *  Have you tolerated food without any problems? Yes.    Have you been able to return to your normal activities? Yes.    Do you have any questions about your discharge instructions: Diet   No. Medications  No. Follow up visit  No.  Do you have questions or concerns about your Care? No.  Actions: * If pain score is 4 or above: No action needed, pain <4.  1. Have you developed a fever since your procedure? no  2.   Have you had an respiratory symptoms (SOB or cough) since your procedure? no  3.   Have you tested positive for COVID 19 since your procedure no  4.   Have you had any family members/close contacts diagnosed with the COVID 19 since your procedure?  no   If yes to any of these questions please route to Laverna Peace, RN and Karlton Lemon, RN

## 2020-10-11 ENCOUNTER — Other Ambulatory Visit: Payer: Self-pay

## 2020-10-11 MED ORDER — METOPROLOL SUCCINATE ER 50 MG PO TB24: ORAL_TABLET | ORAL | 0 refills | Status: DC

## 2021-01-12 ENCOUNTER — Other Ambulatory Visit: Payer: Self-pay

## 2021-01-12 MED ORDER — METOPROLOL SUCCINATE ER 50 MG PO TB24: ORAL_TABLET | ORAL | 2 refills | Status: DC

## 2021-04-16 ENCOUNTER — Other Ambulatory Visit: Payer: Self-pay | Admitting: Internal Medicine

## 2021-07-11 ENCOUNTER — Other Ambulatory Visit: Payer: Self-pay | Admitting: Internal Medicine

## 2021-07-11 DIAGNOSIS — I10 Essential (primary) hypertension: Secondary | ICD-10-CM

## 2021-07-23 NOTE — Progress Notes (Signed)
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 He also has moderate obesity with a target weight of around 171 pounds.    History of Present Illness    07/03/2017  f/u ov/Branston Halsted re: comprehensive eval for hbp Chief Complaint  Patient presents with   Annual Exam  new c/o hem x sev months/ no bleeding  Works out tiw s cp/ no claudication / no sob/no tia rec Anusol HC up to twice daily and soaks in tub (Epson salts) work well  Call after the holidays for referral if needed  Increase toprol to 50 mg one daily  Please remember to go to the lab and x-ray department downstairs in the basement  for your tests - we will call you with the results when they are available. Please schedule a follow up visit in 12 months but call sooner if needed     07/07/2018  f/u ov/Lodie Waheed re: hbp/ ibs  Chief Complaint  Patient presents with   Annual Exam    Pt is fasting. He states overall doing well today and no new co's.  Dyspnea:  Aerobics no problem with sob/ cp/ claudication  Cough:  no Sleeping: ok   migrating abd pain x years some better with citrucel/ diet rec No change rx     07/08/2019  f/u ov/Aleysha Meckler re:  HBP/ IBS Chief Complaint  Patient presents with   Annual Exam    fasting- no co's.   Dyspnea:  Walks 4 x weekly x 30 min some hills x 2 miles  Cough: none Sleeping: flat/ one pillow  SABA use: no inhalers  02: none  Still has occ abd pain with painful bulges in supine position that he can "push away" with his hand and  HB p pizzza.  rec Increase amlopidine to 10 mg daily  We will call to refer you to Dr Thornell Mule for hearing evaluation GERD diet Please remember to go to the lab and x-ray department   for your tests - we will call you with the results when they are  available.    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   Dyspnea:  power walk  Cough: none  Sleeping: no resp problems  SABA use: none  02: none  Covid status:  vax x 4 including    No obvious day to day or daytime variability or assoc excess/ purulent sputum or mucus plugs or hemoptysis or cp or chest tightness, subjective wheeze or overt sinus or hb symptoms.   Sleeping  without nocturnal  or early am exacerbation  of  respiratory  c/o's or need for noct saba. Also denies any obvious fluctuation of symptoms with weather or environmental changes or other aggravating or alleviating factors except as outlined above   No unusual exposure hx or h/o childhood pna/ asthma or knowledge of premature birth.  Current Allergies, Complete Past Medical History, Past Surgical History, Family History, and Social History were reviewed in [employer] record.  ROS  The following are not active complaints unless bolded Hoarseness, sore throat, dysphagia, dental problems, itching, sneezing,  nasal congestion or discharge of excess mucus or purulent secretions, ear ache,   fever, chills, sweats, unintended wt loss or wt gain, classically pleuritic or exertional cp,  orthopnea pnd or arm/hand swelling  or leg swelling, presyncope, palpitations, abdominal pain, anorexia, nausea, vomiting, diarrhea  or change in bowel habits or change  in bladder habits, change in stools or change in urine, dysuria, hematuria,  rash, arthralgias, visual complaints, headache, numbness, weakness or ataxia or problems with walking or coordination,  change in mood or  memory.        Current Meds  Medication Sig   amLODipine (NORVASC) 10 MG tablet TAKE 1 TABLET BY MOUTH EVERY DAY   aspirin 325 MG tablet Take 325 mg by mouth daily.   famotidine (PEPCID) 20 MG tablet Take 20 mg by mouth as needed.    ibuprofen (ADVIL) 200 MG tablet Take 200 mg by mouth every 6 (six) hours as needed.   LUTEIN PO Take 1 tablet by mouth daily.   methylcellulose oral powder Take 1 packet by mouth daily.   metoprolol succinate (TOPROL-XL) 50 MG 24 hr tablet Take with or immediately following a meal.   Omega-3 Fatty Acids (FISH OIL) 1000 MG CAPS Take 1 capsule by mouth daily.   Polyethyl Glycol-Propyl Glycol (SYSTANE OP) Apply 3-4 drops to eye daily.   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 [place].........................................................................Marland KitchenWert  - Tdap  02/24/2013  - Pneumovax 01/2012,  05/26/2014  Prevar - CPX  07/24/2021        Family History:  lung cancer in father  ovarian cancer in mother  no siblings  No FH of Colon Cancer    Social History:  denies ever smoking  Alcohol Use - yes: one glass of wine daily  Illicit Drug Use - no  Daily Caffeine Use: one cup of coffee daily             Objective:   Physical Exam   wts  07/24/2021         184    07/24/2020        187 07/08/2019     186 07/07/2018     186    wt 194 August 09, 2008       HEENT : pt wearing mask not removed for exam due to covid -19 concerns.    NECK :  without JVD/Nodes/TM/ nl carotid upstrokes bilaterally   LUNGS: no acc muscle use,  Nl contour chest which is clear to A and P bilaterally without cough on insp or exp maneuvers   CV:  RRR  no s3 or murmur or increase in P2, and trace pitting both ankles/ good pedal pulses bilaterally   ABD:  soft and nontender with nl inspiratory excursion in the supine position. No bruits or organomegaly appreciated, bowel sounds nl  MS:  Nl gait/ ext warm without deformities, calf tenderness, cyanosis or clubbing No obvious joint restrictions   SKIN: warm and dry without lesions    NEURO:  alert, approp, nl sensorium with  no motor or cerebellar deficits apparent.       CXR PA and Lateral:   07/24/2021 :    I personally reviewed images and agree with radiology impression as follows:    Heart is mildly enlarged. Mild pulmonary vascular congestion is new  Labs ordered/ reviewed:      Chemistry      Component Value Date/Time   NA 135 07/24/2021 1122   K 3.7 07/24/2021 1122   CL 96 07/24/2021 1122   CO2 29 07/24/2021 1122   BUN 15 07/24/2021 1122   CREATININE 1.24 07/24/2021 1122      Component Value Date/Time   CALCIUM 9.5 07/24/2021 1122   ALKPHOS 54 07/24/2021 1122   AST 27 07/24/2021 1122   ALT 26 07/24/2021 1122   BILITOT 0.8 07/24/2021 1122        Lab Results  Component Value Date   WBC 6.5 07/24/2021   HGB 15.0 07/24/2021   HCT 44.3 07/24/2021   MCV 92.4 07/24/2021   PLT 260.0 07/24/2021     No results found for: DDIMER    Lab Results  Component Value Date    TSH 1.00 07/24/2021      Lab Results  Component Value Date   CHOL 226 (H) 07/24/2021   HDL 70.10 07/24/2021   LDLCALC 136 (H) 07/24/2021   LDLDIRECT 125.0 01/26/2010   TRIG 102.0 07/24/2021   CHOLHDL 3 07/24/2021                        Assessment:

## 2021-07-24 ENCOUNTER — Ambulatory Visit (INDEPENDENT_AMBULATORY_CARE_PROVIDER_SITE_OTHER): Payer: Medicare Other

## 2021-07-24 ENCOUNTER — Other Ambulatory Visit: Payer: Self-pay

## 2021-07-24 ENCOUNTER — Ambulatory Visit (INDEPENDENT_AMBULATORY_CARE_PROVIDER_SITE_OTHER): Payer: Medicare Other | Admitting: Internal Medicine

## 2021-07-24 ENCOUNTER — Encounter: Payer: Self-pay | Admitting: Internal Medicine

## 2021-07-24 DIAGNOSIS — N400 Enlarged prostate without lower urinary tract symptoms: Secondary | ICD-10-CM | POA: Diagnosis not present

## 2021-07-24 DIAGNOSIS — I10 Essential (primary) hypertension: Secondary | ICD-10-CM

## 2021-07-24 DIAGNOSIS — K5730 Diverticulosis of large intestine without perforation or abscess without bleeding: Secondary | ICD-10-CM

## 2021-07-24 DIAGNOSIS — E785 Hyperlipidemia, unspecified: Secondary | ICD-10-CM | POA: Diagnosis not present

## 2021-07-24 LAB — CBC WITH DIFFERENTIAL/PLATELET
Basophils Absolute: 0.1 10*3/uL (ref 0.0–0.1)
Basophils Relative: 0.9 % (ref 0.0–3.0)
Eosinophils Absolute: 0.1 10*3/uL (ref 0.0–0.7)
Eosinophils Relative: 1.6 % (ref 0.0–5.0)
HCT: 44.3 % (ref 39.0–52.0)
Hemoglobin: 15 g/dL (ref 13.0–17.0)
Lymphocytes Relative: 23.6 % (ref 12.0–46.0)
Lymphs Abs: 1.5 10*3/uL (ref 0.7–4.0)
MCHC: 33.9 g/dL (ref 30.0–36.0)
MCV: 92.4 fl (ref 78.0–100.0)
Monocytes Absolute: 0.7 10*3/uL (ref 0.1–1.0)
Monocytes Relative: 10.4 % (ref 3.0–12.0)
Neutro Abs: 4.1 10*3/uL (ref 1.4–7.7)
Neutrophils Relative %: 63.5 % (ref 43.0–77.0)
Platelets: 260 10*3/uL (ref 150.0–400.0)
RBC: 4.79 Mil/uL (ref 4.22–5.81)
RDW: 12.9 % (ref 11.5–15.5)
WBC: 6.5 10*3/uL (ref 4.0–10.5)

## 2021-07-24 LAB — HEPATIC FUNCTION PANEL
ALT: 26 U/L (ref 0–53)
AST: 27 U/L (ref 0–37)
Albumin: 4.5 g/dL (ref 3.5–5.2)
Alkaline Phosphatase: 54 U/L (ref 39–117)
Bilirubin, Direct: 0.1 mg/dL (ref 0.0–0.3)
Total Bilirubin: 0.8 mg/dL (ref 0.2–1.2)
Total Protein: 7.2 g/dL (ref 6.0–8.3)

## 2021-07-24 LAB — LIPID PANEL
Cholesterol: 226 mg/dL — ABNORMAL HIGH (ref 0–200)
HDL: 70.1 mg/dL (ref >39.00)
LDL Cholesterol: 136 mg/dL — ABNORMAL HIGH (ref 0–99)
NonHDL: 156.21
Total CHOL/HDL Ratio: 3
Triglycerides: 102 mg/dL (ref 0.0–149.0)
VLDL: 20.4 mg/dL (ref 0.0–40.0)

## 2021-07-24 LAB — BASIC METABOLIC PANEL
BUN: 15 mg/dL (ref 6–23)
CO2: 29 mEq/L (ref 19–32)
Calcium: 9.5 mg/dL (ref 8.4–10.5)
Chloride: 96 mEq/L (ref 96–112)
Creatinine, Ser: 1.24 mg/dL (ref 0.40–1.50)
GFR: 56.88 mL/min — ABNORMAL LOW (ref >60.00)
Glucose, Bld: 103 mg/dL — ABNORMAL HIGH (ref 70–99)
Potassium: 3.7 mEq/L (ref 3.5–5.1)
Sodium: 135 mEq/L (ref 135–145)

## 2021-07-24 LAB — TSH: TSH: 1 u[IU]/mL (ref 0.35–5.50)

## 2021-07-24 NOTE — Patient Instructions (Signed)
Colin Palmer would be a good choice for Primary care (anyone in his office)   Please remember to go to the lab and x-ray department  for your tests - we will call you with the results when they are available.  Follow up here is as needed.

## 2021-07-25 ENCOUNTER — Telehealth: Payer: Self-pay | Admitting: Internal Medicine

## 2021-07-25 ENCOUNTER — Encounter: Payer: Self-pay | Admitting: Internal Medicine

## 2021-07-25 DIAGNOSIS — I5189 Other ill-defined heart diseases: Secondary | ICD-10-CM

## 2021-07-25 DIAGNOSIS — R0989 Other specified symptoms and signs involving the circulatory and respiratory systems: Secondary | ICD-10-CM

## 2021-07-25 NOTE — Telephone Encounter (Signed)
Nyoka Cowden, MD  07/25/2021  5:59 AM EST     Call patient :  cxr suggests blood pressure is starting to affect his heart - let's do echo then decide whether to send him now for cards eval in [place] or wait until he gets established in [place] which would be fine  if echo is normal     Patient is aware of results and voiced his understanding.  Echo has been ordered.  Nothing further needed at this time/

## 2021-07-25 NOTE — Assessment & Plan Note (Signed)
Diverticulosis by colonoscopy July 2003, followed by [facility]     Lab Results  Component Value Date   HGB 15.0 07/24/2021   HGB 16.2 07/24/2020   HGB 16.7 07/08/2019    No symptoms, f/u per GI

## 2021-07-25 NOTE — Assessment & Plan Note (Signed)
Target LDL < 130 due to hbp/ male gender/ type A  Lab Results  Component Value Date   CHOL 226 (H) 07/24/2021   HDL 70.10 07/24/2021   LDLCALC 136 (H) 07/24/2021   LDLDIRECT 125.0 01/26/2010   TRIG 102.0 07/24/2021   CHOLHDL 3 07/24/2021     HDL /LDL ratio very favorable but will need f/u by PCP in [facility] >  Referred to [facility]

## 2021-07-25 NOTE — Assessment & Plan Note (Signed)
-   Declined psa screening 02/18/2012 and 02/24/13 and 05/26/14 and 07/24/2021    Now 75, no symptoms, getting prostate exams with colonoscopies but will need step up surveillance yearly / advised > f/u with new PCP planned this year in [place] as has now moved there.          Each maintenance medication was reviewed in detail including emphasizing most importantly the difference between maintenance and prns and under what circumstances the prns are to be triggered using an action plan format where appropriate.  Total time for H and P, chart review, counseling,  and generating customized AVS unique to this office visit / same day charting = 25 min

## 2021-07-25 NOTE — Assessment & Plan Note (Signed)
Lab Results  Component Value Date   CREATININE 1.24 07/24/2021   CREATININE 1.29 07/24/2020   CREATININE 1.39 07/08/2019     cxr suggest mild cm/ congestion ? Developing diastolic dysfunction > rec echo and refer to cards if abnorma, o/w can wait until established in [place].

## 2021-07-30 ENCOUNTER — Other Ambulatory Visit: Payer: Self-pay

## 2021-07-30 ENCOUNTER — Ambulatory Visit (HOSPITAL_COMMUNITY)
Admission: RE | Admit: 2021-07-30 | Discharge: 2021-07-30 | Disposition: A | Discharge status: Home / Self Care | Payer: Medicare Other | Source: Ambulatory Visit | Attending: Internal Medicine | Admitting: Internal Medicine

## 2021-07-30 DIAGNOSIS — I10 Essential (primary) hypertension: Secondary | ICD-10-CM | POA: Insufficient documentation

## 2021-07-30 DIAGNOSIS — E785 Hyperlipidemia, unspecified: Secondary | ICD-10-CM | POA: Insufficient documentation

## 2021-07-30 DIAGNOSIS — I5189 Other ill-defined heart diseases: Secondary | ICD-10-CM | POA: Insufficient documentation

## 2021-07-30 DIAGNOSIS — I351 Nonrheumatic aortic (valve) insufficiency: Secondary | ICD-10-CM | POA: Diagnosis not present

## 2021-07-30 LAB — ECHOCARDIOGRAM COMPLETE
Area-P 1/2: 2.83 cm2
Calc EF: 58.9 %
S' Lateral: 3.8 cm
Single Plane A2C EF: 61.1 %
Single Plane A4C EF: 59.1 %

## 2021-08-01 NOTE — Progress Notes (Signed)
Spoke with pt and notified of results per Dr. Wert. Pt verbalized understanding and denied any questions. 

## 2021-10-05 ENCOUNTER — Other Ambulatory Visit: Payer: Self-pay | Admitting: Internal Medicine

## 2021-11-01 ENCOUNTER — Other Ambulatory Visit: Payer: Self-pay | Admitting: Internal Medicine

## 2022-01-05 ENCOUNTER — Other Ambulatory Visit: Payer: Self-pay | Admitting: Internal Medicine

## 2022-01-05 DIAGNOSIS — I10 Essential (primary) hypertension: Secondary | ICD-10-CM

## 2022-08-04 IMAGING — DX DG CHEST 2V
2 series · 2 of 2 positions shown · non-contrast
Comparison: Two-view chest x-ray 09/07/2018

CLINICAL DATA: Blood pressure.

EXAM:
CHEST - 2 VIEW

[chest pa]
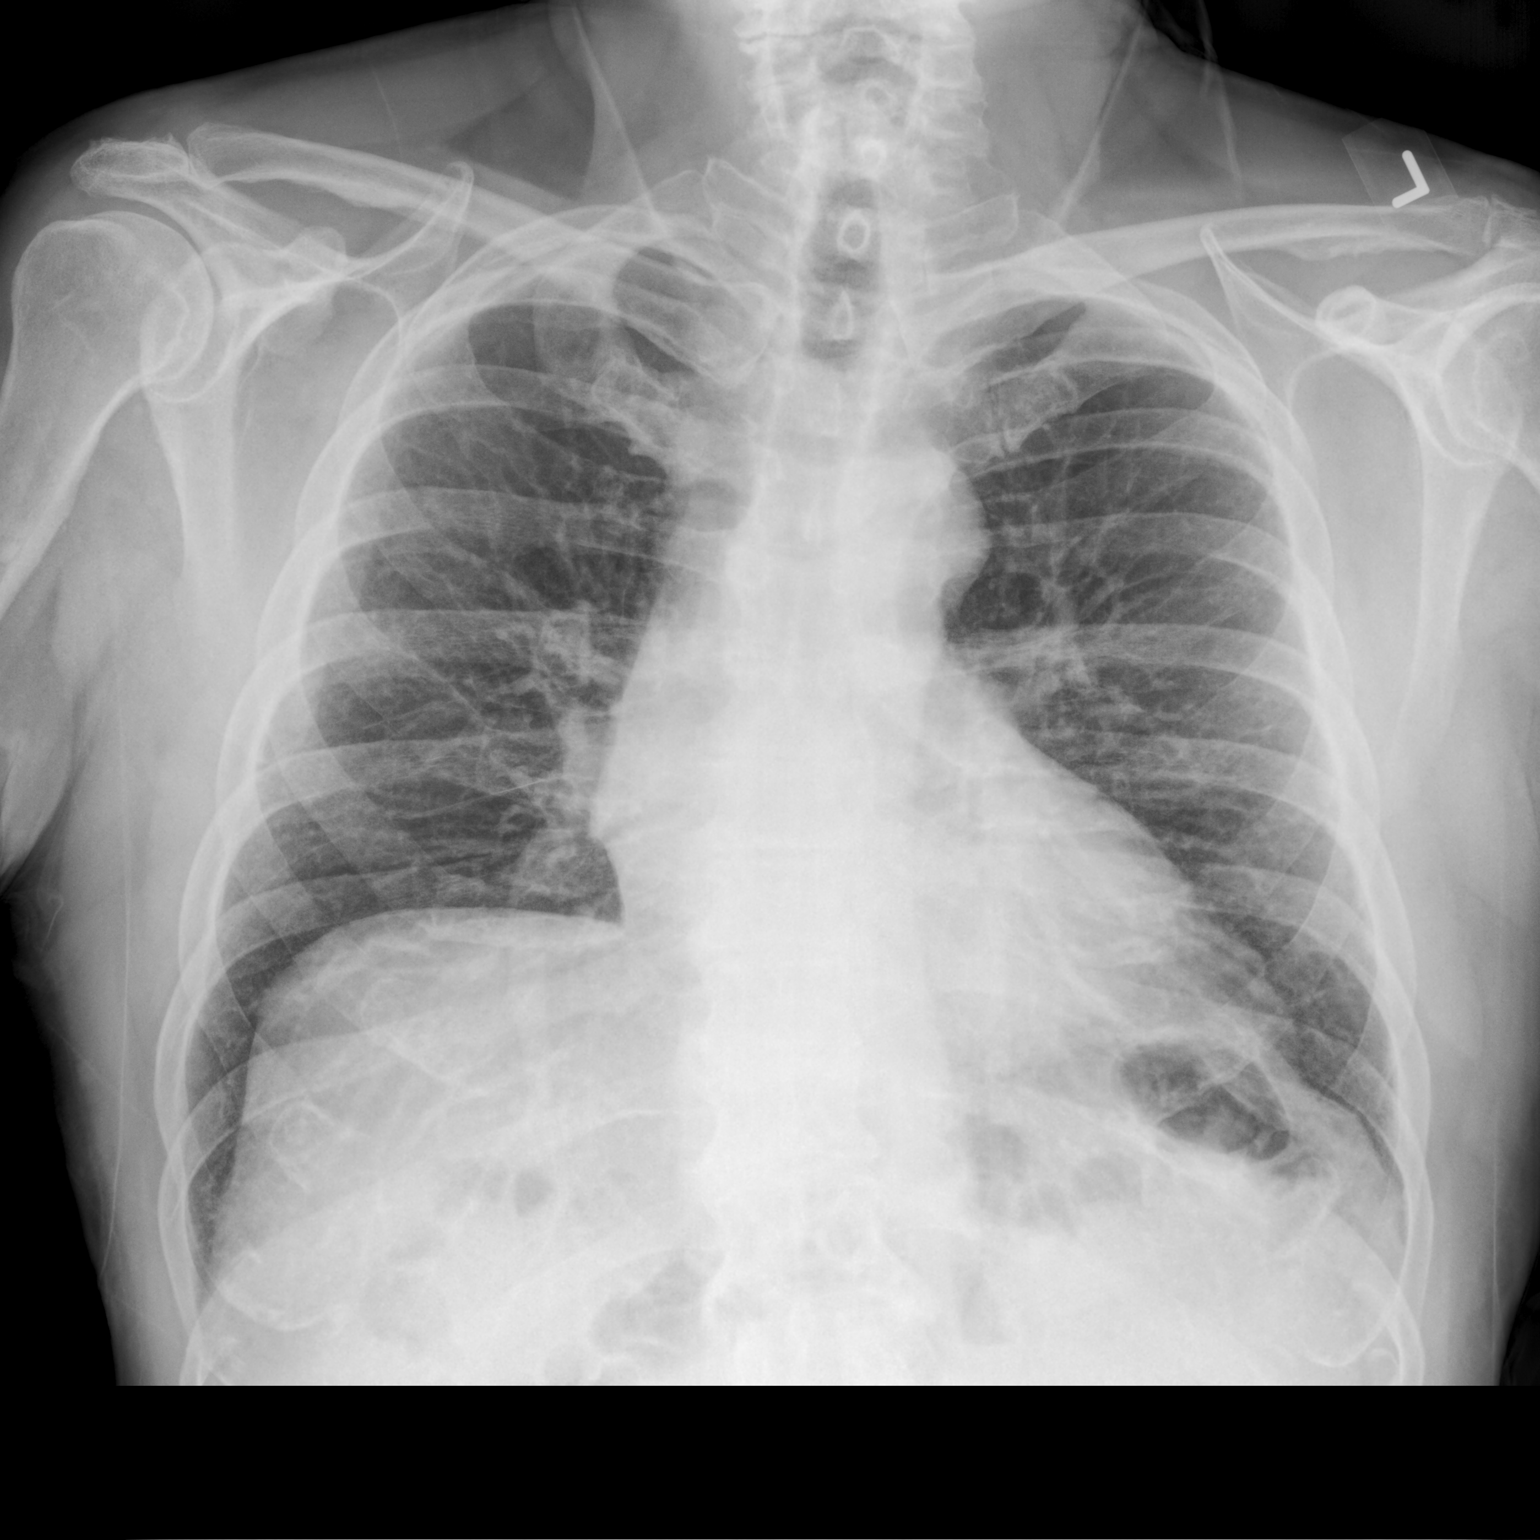

[chest lat]
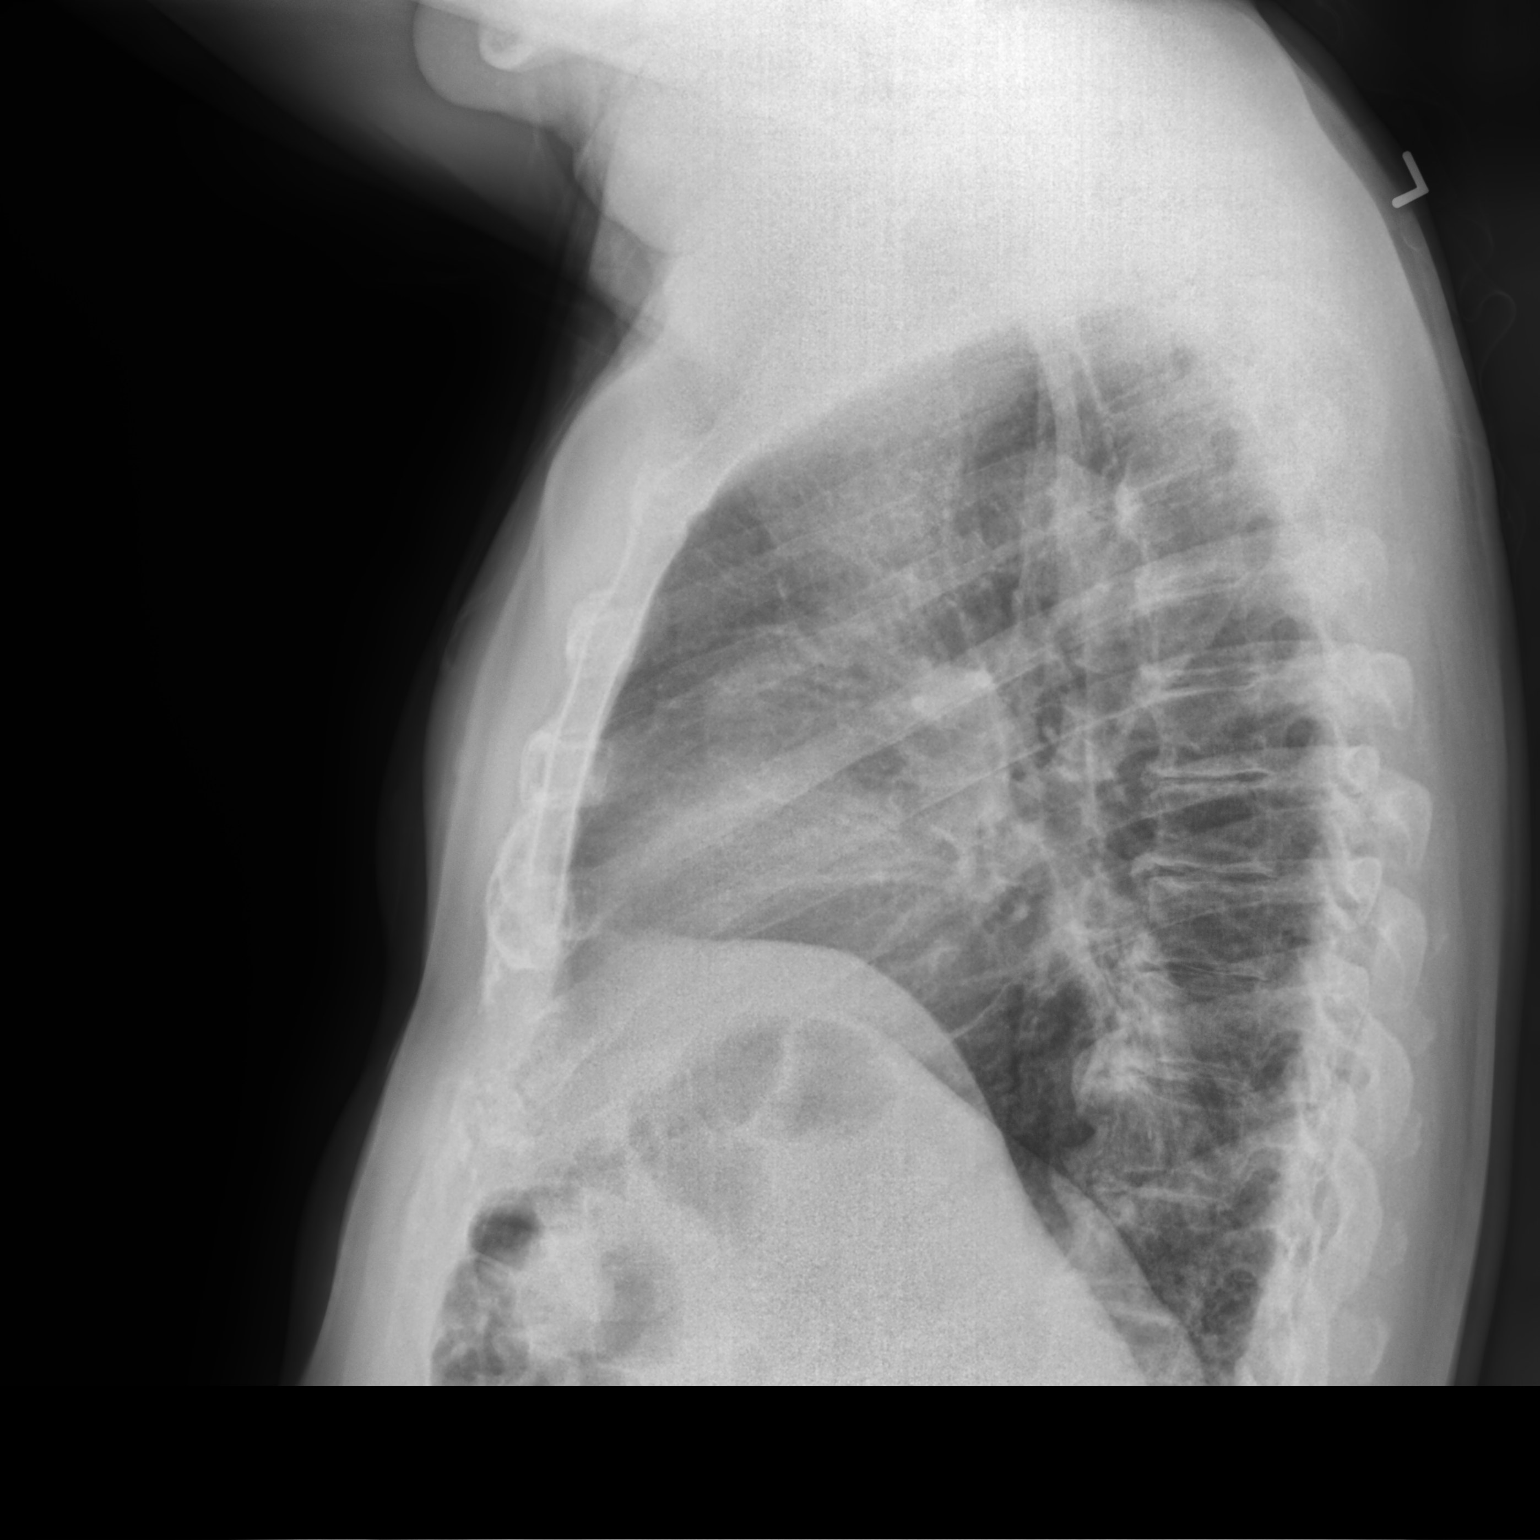

[2 of 2 positions shown; findings below may reference images not displayed]

FINDINGS: Heart is mildly enlarged. Mild pulmonary vascular congestion is new.
No edema or airspace disease is present. No effusions are present.
Multilevel degenerative changes are present in the thoracic spine.
IMPRESSION: New mild pulmonary vascular congestion.
